# Patient Record
Sex: Female | Born: 1960 | Race: White | Hispanic: No | Marital: Married | Smoking: Never smoker
Health system: Southern US, Community
[De-identification: ages and names within clinical notes are randomized; demographics above are authoritative.]

## PROBLEM LIST (undated history)

## (undated) DIAGNOSIS — F411 Generalized anxiety disorder: Secondary | ICD-10-CM

## (undated) DIAGNOSIS — J069 Acute upper respiratory infection, unspecified: Secondary | ICD-10-CM

## (undated) DIAGNOSIS — J019 Acute sinusitis, unspecified: Secondary | ICD-10-CM

## (undated) DIAGNOSIS — R05 Cough: Secondary | ICD-10-CM

## (undated) DIAGNOSIS — K5900 Constipation, unspecified: Secondary | ICD-10-CM

## (undated) DIAGNOSIS — R1011 Right upper quadrant pain: Secondary | ICD-10-CM

## (undated) DIAGNOSIS — F329 Major depressive disorder, single episode, unspecified: Secondary | ICD-10-CM

## (undated) DIAGNOSIS — R229 Localized swelling, mass and lump, unspecified: Secondary | ICD-10-CM

## (undated) DIAGNOSIS — R5383 Other fatigue: Secondary | ICD-10-CM

## (undated) DIAGNOSIS — R059 Cough, unspecified: Secondary | ICD-10-CM

## (undated) DIAGNOSIS — R0602 Shortness of breath: Secondary | ICD-10-CM

## (undated) DIAGNOSIS — Z13220 Encounter for screening for lipoid disorders: Secondary | ICD-10-CM

## (undated) DIAGNOSIS — F3289 Other specified depressive episodes: Secondary | ICD-10-CM

## (undated) DIAGNOSIS — G4700 Insomnia, unspecified: Secondary | ICD-10-CM

## (undated) DIAGNOSIS — R5381 Other malaise: Secondary | ICD-10-CM

## (undated) HISTORY — DX: Shortness of breath: R06.02

## (undated) HISTORY — DX: Generalized anxiety disorder: F41.1

## (undated) HISTORY — PX: COSMETIC SURGERY: SHX468

## (undated) HISTORY — DX: Right upper quadrant pain: R10.11

## (undated) HISTORY — DX: Acute upper respiratory infection, unspecified: J06.9

## (undated) HISTORY — DX: Insomnia, unspecified: G47.00

## (undated) HISTORY — DX: Constipation, unspecified: K59.00

## (undated) HISTORY — DX: Other specified depressive episodes: F32.89

## (undated) HISTORY — DX: Localized swelling, mass and lump, unspecified: R22.9

## (undated) HISTORY — DX: Major depressive disorder, single episode, unspecified: F32.9

## (undated) HISTORY — PX: INCONTINENCE SURGERY: SHX676

## (undated) HISTORY — PX: FINGER AMPUTATION: SHX636

## (undated) HISTORY — DX: Other malaise: R53.81

## (undated) HISTORY — DX: Acute sinusitis, unspecified: J01.90

## (undated) HISTORY — DX: Cough: R05

## (undated) HISTORY — DX: Encounter for screening for lipoid disorders: Z13.220

## (undated) HISTORY — DX: Cough, unspecified: R05.9

## (undated) HISTORY — DX: Other fatigue: R53.83

## (undated) HISTORY — PX: BREAST SURGERY: SHX581

---

## 2004-01-23 ENCOUNTER — Observation Stay (HOSPITAL_COMMUNITY): Admission: RE | Admit: 2004-01-23 | Discharge: 2004-01-24 | Payer: Self-pay | Admitting: Gynecology

## 2004-01-25 ENCOUNTER — Emergency Department: Payer: Self-pay | Admitting: Gynecology

## 2004-01-27 ENCOUNTER — Ambulatory Visit (HOSPITAL_COMMUNITY): Admission: RE | Admit: 2004-01-27 | Discharge: 2004-01-27 | Payer: Self-pay | Admitting: Gynecology

## 2005-01-28 ENCOUNTER — Ambulatory Visit (HOSPITAL_COMMUNITY): Admission: RE | Admit: 2005-01-28 | Discharge: 2005-01-28 | Payer: Self-pay | Admitting: Gynecology

## 2005-12-12 ENCOUNTER — Ambulatory Visit: Payer: Self-pay | Admitting: Gynecology

## 2005-12-12 ENCOUNTER — Encounter (INDEPENDENT_AMBULATORY_CARE_PROVIDER_SITE_OTHER): Payer: Self-pay | Admitting: Gynecology

## 2006-06-28 ENCOUNTER — Ambulatory Visit (HOSPITAL_COMMUNITY): Admission: RE | Admit: 2006-06-28 | Discharge: 2006-06-28 | Payer: Self-pay | Admitting: Gynecology

## 2006-07-19 ENCOUNTER — Ambulatory Visit: Payer: Self-pay | Admitting: Family Medicine

## 2006-07-19 DIAGNOSIS — F3289 Other specified depressive episodes: Secondary | ICD-10-CM | POA: Insufficient documentation

## 2006-07-19 DIAGNOSIS — K5900 Constipation, unspecified: Secondary | ICD-10-CM | POA: Insufficient documentation

## 2006-07-19 DIAGNOSIS — F329 Major depressive disorder, single episode, unspecified: Secondary | ICD-10-CM

## 2006-07-19 DIAGNOSIS — G4700 Insomnia, unspecified: Secondary | ICD-10-CM

## 2006-07-20 ENCOUNTER — Encounter: Payer: Self-pay | Admitting: Family Medicine

## 2006-09-26 ENCOUNTER — Encounter: Payer: Self-pay | Admitting: Family Medicine

## 2006-09-26 LAB — CONVERTED CEMR LAB
ALT: 16 units/L (ref 0–40)
AST: 19 units/L (ref 0–37)
Basophils Relative: 1.8 % — ABNORMAL HIGH (ref 0.0–1.0)
Bilirubin, Direct: 0.1 mg/dL (ref 0.0–0.3)
Calcium: 9.1 mg/dL (ref 8.4–10.5)
Chloride: 105 meq/L (ref 96–112)
Creatinine, Ser: 0.9 mg/dL (ref 0.4–1.2)
Eosinophils Absolute: 0.3 10*3/uL (ref 0.0–0.6)
Eosinophils Relative: 5 % (ref 0.0–5.0)
GFR calc non Af Amer: 72 mL/min
Glucose, Bld: 74 mg/dL (ref 70–99)
HCT: 38.8 % (ref 36.0–46.0)
MCV: 90.2 fL (ref 78.0–100.0)
Neutrophils Relative %: 62.7 % (ref 43.0–77.0)
Platelets: 309 10*3/uL (ref 150–400)
RBC: 4.3 M/uL (ref 3.87–5.11)
RDW: 12.8 % (ref 11.5–14.6)
Total Bilirubin: 0.6 mg/dL (ref 0.3–1.2)
WBC: 7 10*3/uL (ref 4.5–10.5)

## 2006-09-28 ENCOUNTER — Emergency Department (HOSPITAL_COMMUNITY): Admission: EM | Admit: 2006-09-28 | Discharge: 2006-09-28 | Payer: Self-pay | Admitting: Emergency Medicine

## 2006-10-09 ENCOUNTER — Ambulatory Visit: Payer: Self-pay | Admitting: Family Medicine

## 2006-10-09 DIAGNOSIS — F411 Generalized anxiety disorder: Secondary | ICD-10-CM | POA: Insufficient documentation

## 2006-10-09 LAB — CONVERTED CEMR LAB
HDL: 60.6 mg/dL (ref >39.0)
LDL Cholesterol: 106 mg/dL — ABNORMAL HIGH (ref 0–99)
Triglycerides: 61 mg/dL (ref 0–149)
VLDL: 12 mg/dL (ref 0–40)

## 2006-11-09 ENCOUNTER — Ambulatory Visit: Payer: Self-pay | Admitting: Family Medicine

## 2006-11-22 LAB — CONVERTED CEMR LAB: Pap Smear: NORMAL

## 2007-01-22 ENCOUNTER — Ambulatory Visit: Payer: Self-pay | Admitting: Family Medicine

## 2007-02-09 ENCOUNTER — Ambulatory Visit: Payer: Self-pay | Admitting: Family Medicine

## 2007-03-04 ENCOUNTER — Emergency Department (HOSPITAL_COMMUNITY): Admission: EM | Admit: 2007-03-04 | Discharge: 2007-03-04 | Payer: Self-pay | Admitting: Family Medicine

## 2007-03-23 ENCOUNTER — Ambulatory Visit: Payer: Self-pay | Admitting: Family Medicine

## 2007-04-19 ENCOUNTER — Encounter (INDEPENDENT_AMBULATORY_CARE_PROVIDER_SITE_OTHER): Payer: Self-pay | Admitting: Gynecology

## 2007-04-19 ENCOUNTER — Ambulatory Visit: Payer: Self-pay | Admitting: Obstetrics & Gynecology

## 2007-05-14 ENCOUNTER — Ambulatory Visit: Payer: Self-pay | Admitting: Family Medicine

## 2007-06-26 ENCOUNTER — Ambulatory Visit: Payer: Self-pay | Admitting: Obstetrics & Gynecology

## 2007-07-10 ENCOUNTER — Ambulatory Visit: Payer: Self-pay | Admitting: Family Medicine

## 2007-07-11 ENCOUNTER — Encounter: Admission: RE | Admit: 2007-07-11 | Discharge: 2007-07-11 | Payer: Self-pay | Admitting: Family Medicine

## 2007-07-11 ENCOUNTER — Telehealth (INDEPENDENT_AMBULATORY_CARE_PROVIDER_SITE_OTHER): Payer: Self-pay | Admitting: Internal Medicine

## 2007-07-12 ENCOUNTER — Encounter: Admission: RE | Admit: 2007-07-12 | Discharge: 2007-07-12 | Payer: Self-pay | Admitting: Obstetrics & Gynecology

## 2007-07-12 ENCOUNTER — Telehealth (INDEPENDENT_AMBULATORY_CARE_PROVIDER_SITE_OTHER): Payer: Self-pay | Admitting: Internal Medicine

## 2007-07-13 ENCOUNTER — Telehealth (INDEPENDENT_AMBULATORY_CARE_PROVIDER_SITE_OTHER): Payer: Self-pay | Admitting: Internal Medicine

## 2007-07-16 ENCOUNTER — Emergency Department (HOSPITAL_COMMUNITY): Admission: EM | Admit: 2007-07-16 | Discharge: 2007-07-16 | Payer: Self-pay | Admitting: Emergency Medicine

## 2007-07-25 ENCOUNTER — Ambulatory Visit: Payer: Self-pay | Admitting: Family Medicine

## 2007-07-25 LAB — CONVERTED CEMR LAB
GFR calc Af Amer: 116 mL/min
Sodium: 134 meq/L — ABNORMAL LOW (ref 135–145)

## 2007-07-27 ENCOUNTER — Ambulatory Visit: Payer: Self-pay | Admitting: Cardiovascular Disease

## 2007-08-03 ENCOUNTER — Telehealth: Payer: Self-pay | Admitting: Family Medicine

## 2007-08-07 ENCOUNTER — Telehealth: Payer: Self-pay | Admitting: Family Medicine

## 2007-08-07 DIAGNOSIS — R05 Cough: Secondary | ICD-10-CM

## 2007-08-09 ENCOUNTER — Ambulatory Visit: Payer: Self-pay | Admitting: Internal Medicine

## 2007-08-13 ENCOUNTER — Telehealth (INDEPENDENT_AMBULATORY_CARE_PROVIDER_SITE_OTHER): Payer: Self-pay | Admitting: *Deleted

## 2007-08-14 ENCOUNTER — Ambulatory Visit: Payer: Self-pay | Admitting: Cardiovascular Disease

## 2007-09-26 ENCOUNTER — Ambulatory Visit: Payer: Self-pay | Admitting: Internal Medicine

## 2007-12-31 ENCOUNTER — Encounter: Payer: Self-pay | Admitting: Family Medicine

## 2008-01-08 ENCOUNTER — Ambulatory Visit: Payer: Self-pay | Admitting: Obstetrics and Gynecology

## 2008-01-10 ENCOUNTER — Encounter: Admission: RE | Admit: 2008-01-10 | Discharge: 2008-01-10 | Payer: Self-pay | Admitting: Obstetrics & Gynecology

## 2008-01-23 ENCOUNTER — Ambulatory Visit: Payer: Self-pay | Admitting: Family Medicine

## 2008-01-23 DIAGNOSIS — H8110 Benign paroxysmal vertigo, unspecified ear: Secondary | ICD-10-CM | POA: Insufficient documentation

## 2008-02-13 ENCOUNTER — Encounter (INDEPENDENT_AMBULATORY_CARE_PROVIDER_SITE_OTHER): Payer: Self-pay | Admitting: Internal Medicine

## 2008-02-13 ENCOUNTER — Ambulatory Visit: Payer: Self-pay | Admitting: Family Medicine

## 2008-02-14 ENCOUNTER — Ambulatory Visit: Payer: Self-pay | Admitting: Internal Medicine

## 2008-02-14 LAB — CONVERTED CEMR LAB
ALT: 18 units/L (ref 0–35)
Albumin: 4.1 g/dL (ref 3.5–5.2)
Basophils Absolute: 0 10*3/uL (ref 0.0–0.1)
Basophils Relative: 1 % (ref 0–1)
Eosinophils Absolute: 0.2 10*3/uL (ref 0.0–0.7)
Eosinophils Relative: 3 % (ref 0–5)
HCT: 37.1 % (ref 36.0–46.0)
Hemoglobin: 12.1 g/dL (ref 12.0–15.0)
Lipase: 36 units/L (ref 0–75)
MCHC: 32.6 g/dL (ref 30.0–36.0)
MCV: 89.6 fL (ref 78.0–100.0)
Monocytes Absolute: 0.6 10*3/uL (ref 0.1–1.0)
Monocytes Relative: 10 % (ref 3–12)
RBC: 4.14 M/uL (ref 3.87–5.11)
RDW: 13.4 % (ref 11.5–15.5)
Total Protein: 7.4 g/dL (ref 6.0–8.3)

## 2008-02-18 ENCOUNTER — Telehealth: Payer: Self-pay | Admitting: Family Medicine

## 2008-02-18 DIAGNOSIS — J984 Other disorders of lung: Secondary | ICD-10-CM

## 2008-02-19 ENCOUNTER — Ambulatory Visit: Payer: Self-pay | Admitting: Cardiology

## 2008-02-20 ENCOUNTER — Encounter (INDEPENDENT_AMBULATORY_CARE_PROVIDER_SITE_OTHER): Payer: Self-pay | Admitting: Internal Medicine

## 2008-04-15 ENCOUNTER — Ambulatory Visit: Payer: Self-pay | Admitting: Family Medicine

## 2008-06-16 ENCOUNTER — Ambulatory Visit: Payer: Self-pay | Admitting: Family Medicine

## 2008-06-17 LAB — CONVERTED CEMR LAB
ALT: 17 units/L (ref 0–35)
AST: 18 units/L (ref 0–37)
BUN: 15 mg/dL (ref 6–23)
Bilirubin, Direct: 0 mg/dL (ref 0.0–0.3)
CO2: 30 meq/L (ref 19–32)
Chloride: 110 meq/L (ref 96–112)
Cholesterol: 211 mg/dL — ABNORMAL HIGH (ref 0–200)
Creatinine, Ser: 1 mg/dL (ref 0.4–1.2)
Potassium: 4.2 meq/L (ref 3.5–5.1)
TSH: 1.21 microintl units/mL (ref 0.35–5.50)
Total Bilirubin: 0.6 mg/dL (ref 0.3–1.2)
Total Protein: 6.8 g/dL (ref 6.0–8.3)
VLDL: 23.8 mg/dL (ref 0.0–40.0)
Vitamin B-12: 266 pg/mL (ref 211–911)

## 2008-06-18 ENCOUNTER — Encounter: Payer: Self-pay | Admitting: Family Medicine

## 2008-06-18 ENCOUNTER — Other Ambulatory Visit: Admission: RE | Admit: 2008-06-18 | Discharge: 2008-06-18 | Payer: Self-pay | Admitting: Family Medicine

## 2008-06-18 ENCOUNTER — Ambulatory Visit: Payer: Self-pay | Admitting: Family Medicine

## 2008-06-23 ENCOUNTER — Encounter (INDEPENDENT_AMBULATORY_CARE_PROVIDER_SITE_OTHER): Payer: Self-pay | Admitting: *Deleted

## 2008-07-14 ENCOUNTER — Encounter: Admission: RE | Admit: 2008-07-14 | Discharge: 2008-07-14 | Payer: Self-pay | Admitting: Family Medicine

## 2008-08-26 ENCOUNTER — Telehealth: Payer: Self-pay | Admitting: Family Medicine

## 2009-04-14 ENCOUNTER — Ambulatory Visit: Payer: Self-pay | Admitting: Family Medicine

## 2009-05-20 IMAGING — CT CT ABDOMEN W/ CM
2 of 5 series · 17 of 46 positions shown, 19 images · IV contrast (Omnipaque 300)
Comparison: None.

CT ABDOMEN

CLINICAL DATA: Severe right upper quadrant and epigastric
abdominal pain that increases after meals, for the past 5 days.
Chronic constipation.

CT ABDOMEN AND PELVIS WITH CONTRAST
TECHNIQUE: Multidetector CT imaging of the abdomen and pelvis was
performed using the standard protocol following bolus
administration of intravenous contrast.
Contrast: 100 ml Imnipaque-RRR

[Series 2: abd_pel 5.0 b30f st · axial · 0.74mm/px · z∈[-447,-47]mm · 14 of 90 slices shown, 16 images]
[im 5/90  soft-tissue]
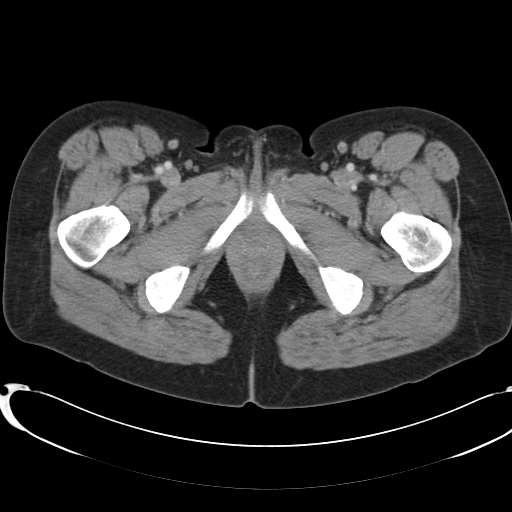
[im 5/90  bone]
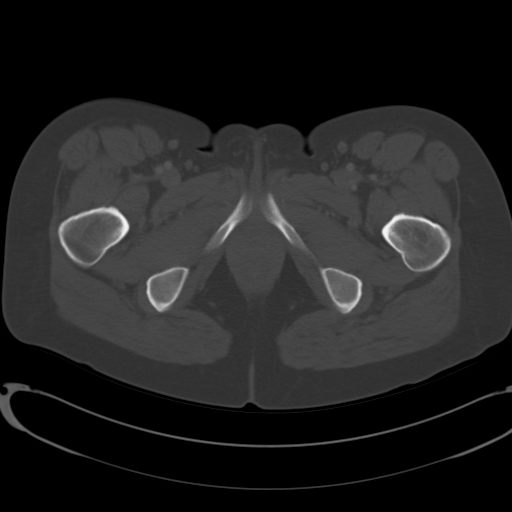
[im 10/90  soft-tissue]
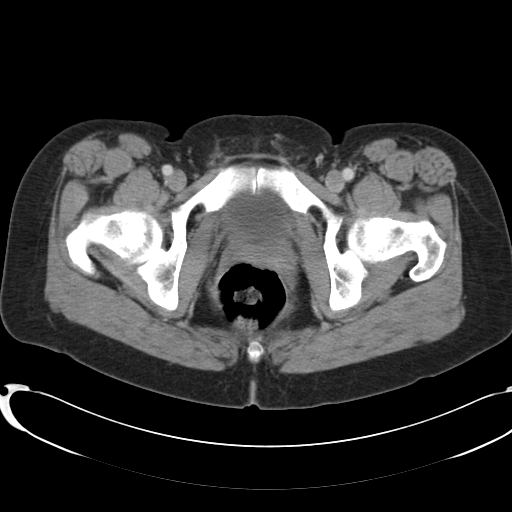
[im 19/90  soft-tissue]
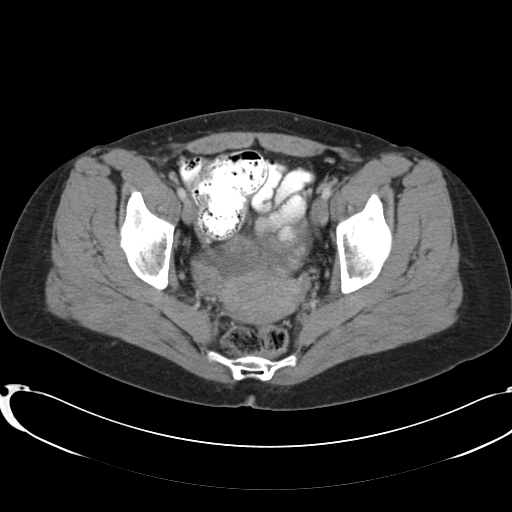
[im 24/90  soft-tissue]
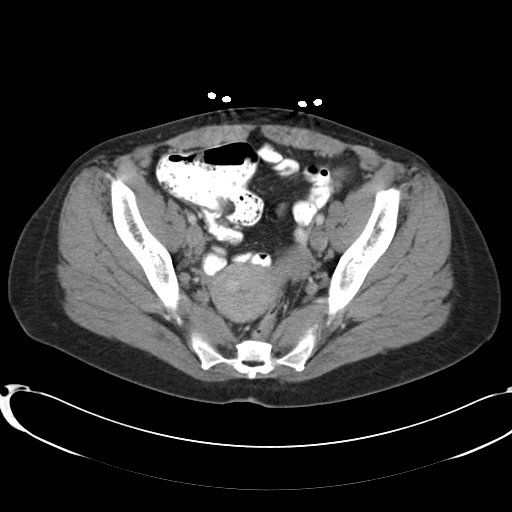
[im 29/90  soft-tissue]
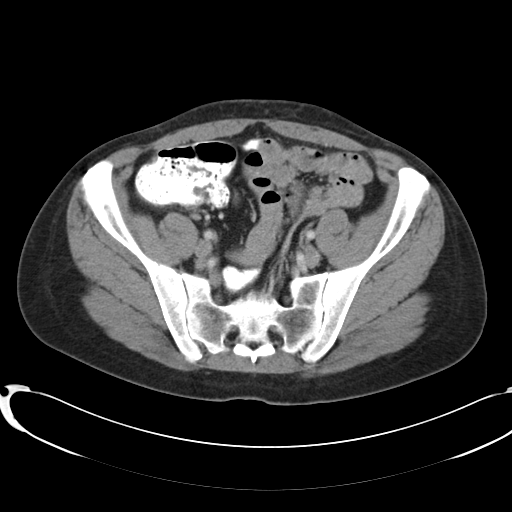
[im 38/90  soft-tissue]
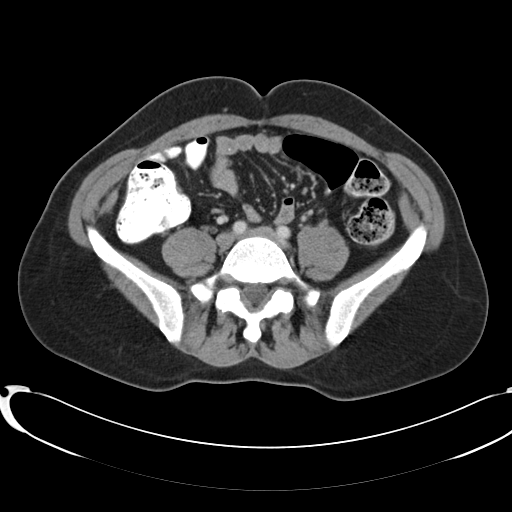
[im 43/90  soft-tissue]
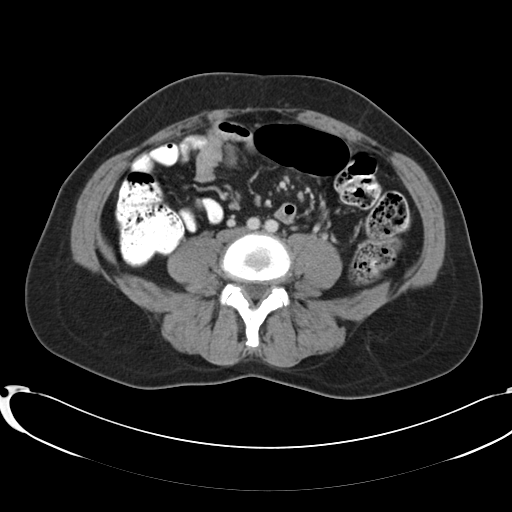
[im 47/90  soft-tissue]
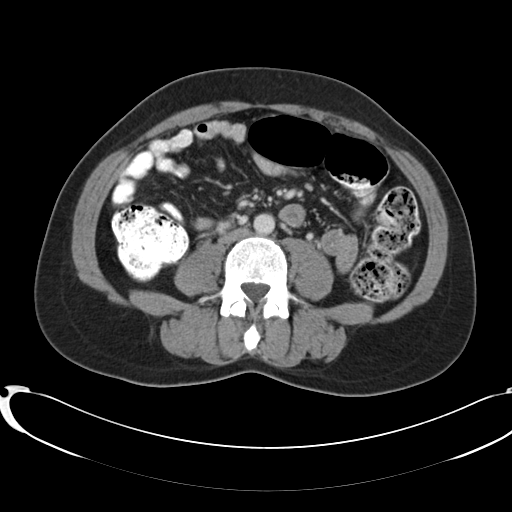
[im 52/90  soft-tissue]
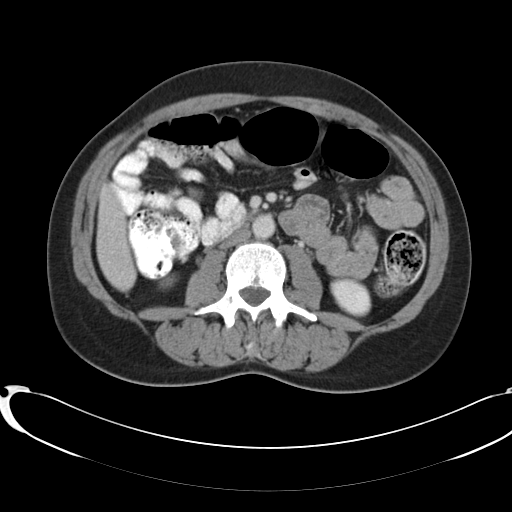
[im 52/90  bone]
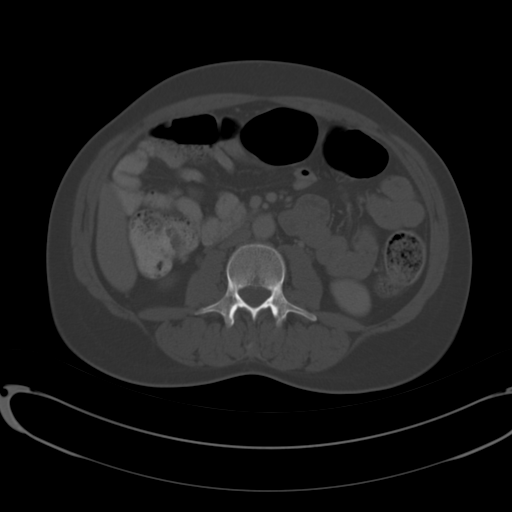
[im 61/90  soft-tissue]
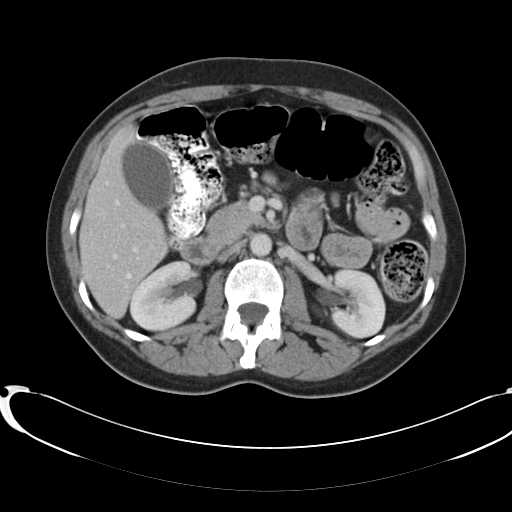
[im 66/90  soft-tissue]
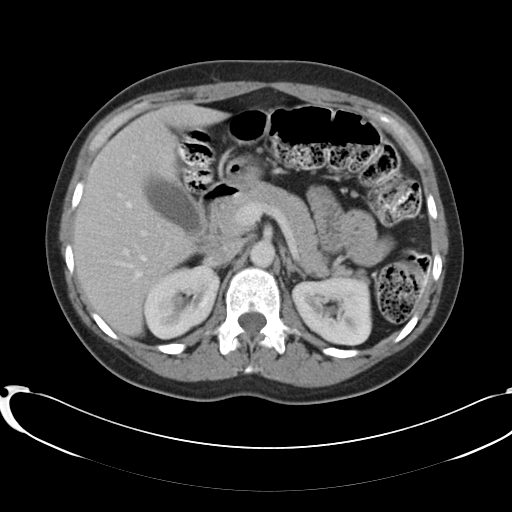
[im 71/90  soft-tissue]
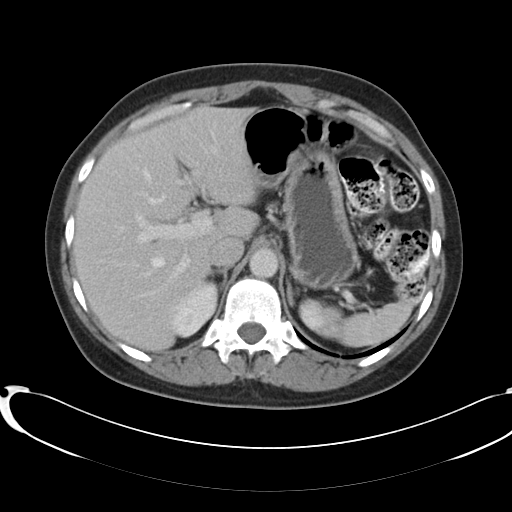
[im 80/90  soft-tissue]
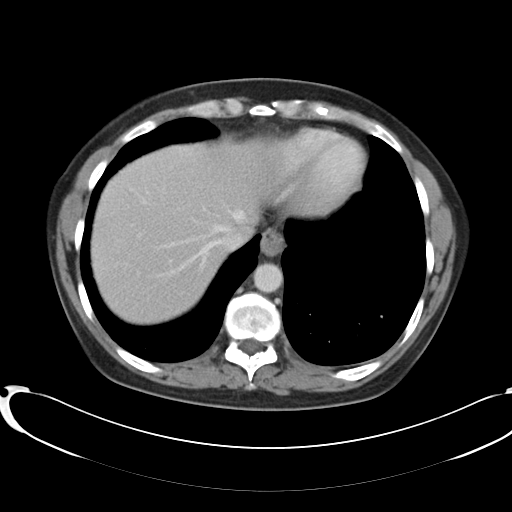
[im 85/90  soft-tissue]
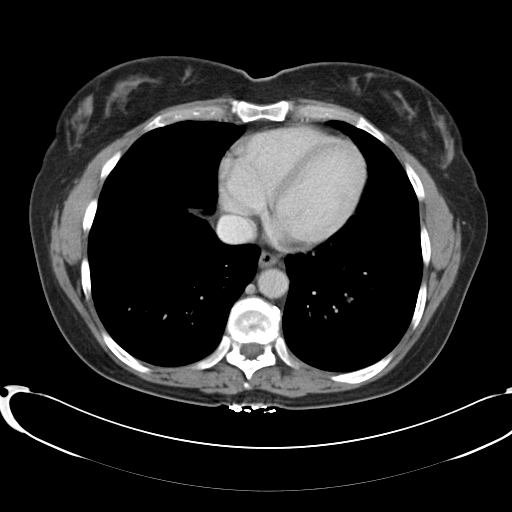

[Series 602: <mpr thick range> · coronal · 0.89mm/px · 3 of 86 slices shown]
[im 29/86  soft-tissue]
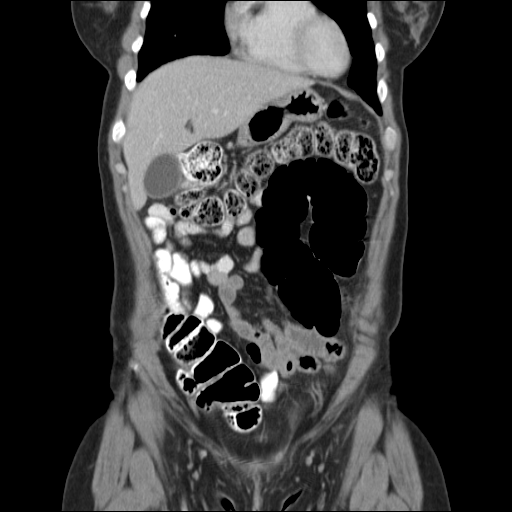
[im 38/86  soft-tissue]
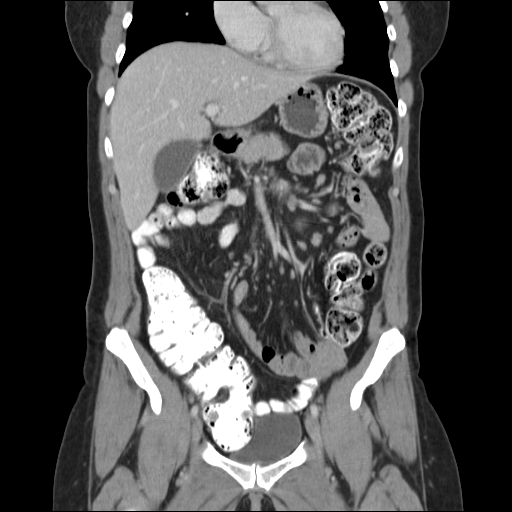
[im 48/86  soft-tissue]
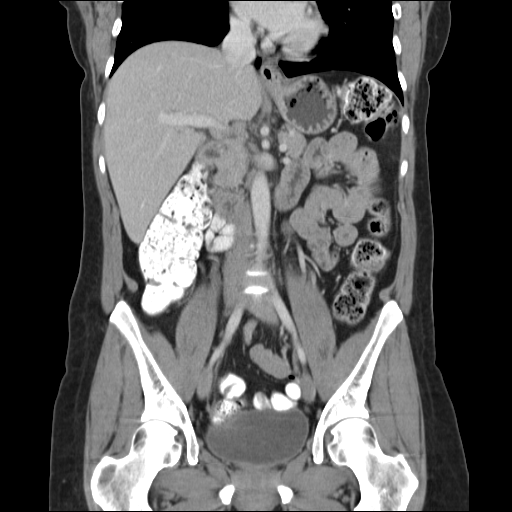

[17 of 46 positions shown; findings below may reference images not displayed]

FINDINGS: 5 mm subpleural nodule in the right lower lobe
laterally.  Unremarkable liver, spleen, pancreas, gallbladder,
adrenal glands and left kidney.  Tiny upper pole right renal cyst.
No gastrointestinal abnormalities or enlarged lymph nodes.  No free
peritoneal fluid or air.  Unremarkable bones.
IMPRESSION: 1.  5 mm right lobe nodule.  Chest CT, with contrast, is
recommended to evaluate for the presence or absence of other
nodules and to establish a baseline for follow up.
2.  No acute abnormality.

CT PELVIS
FINDINGS: Unremarkable urinary bladder, uterus, ovaries and bowel
loops.  No masses, enlarged lymph nodes, free peritoneal fluid or
free peritoneal air.  Unremarkable bones.
IMPRESSION: Normal examination.

## 2009-08-20 ENCOUNTER — Ambulatory Visit: Payer: Self-pay | Admitting: Family Medicine

## 2009-08-20 DIAGNOSIS — M19049 Primary osteoarthritis, unspecified hand: Secondary | ICD-10-CM | POA: Insufficient documentation

## 2009-09-29 ENCOUNTER — Ambulatory Visit: Payer: Self-pay | Admitting: Internal Medicine

## 2009-09-29 DIAGNOSIS — R42 Dizziness and giddiness: Secondary | ICD-10-CM

## 2009-11-03 ENCOUNTER — Telehealth (INDEPENDENT_AMBULATORY_CARE_PROVIDER_SITE_OTHER): Payer: Self-pay | Admitting: *Deleted

## 2009-11-05 ENCOUNTER — Ambulatory Visit: Payer: Self-pay | Admitting: Family Medicine

## 2009-11-06 LAB — CONVERTED CEMR LAB
Alkaline Phosphatase: 55 units/L (ref 39–117)
Bilirubin, Direct: 0.1 mg/dL (ref 0.0–0.3)
Calcium: 9.2 mg/dL (ref 8.4–10.5)
Cholesterol: 234 mg/dL — ABNORMAL HIGH (ref 0–200)
Creatinine, Ser: 0.8 mg/dL (ref 0.4–1.2)
GFR calc non Af Amer: 84.59 mL/min (ref >60)
Sodium: 141 meq/L (ref 135–145)
Total Bilirubin: 0.5 mg/dL (ref 0.3–1.2)
Total CHOL/HDL Ratio: 4
Triglycerides: 54 mg/dL (ref 0.0–149.0)
VLDL: 10.8 mg/dL (ref 0.0–40.0)

## 2009-11-10 ENCOUNTER — Ambulatory Visit: Payer: Self-pay | Admitting: Family Medicine

## 2009-11-10 DIAGNOSIS — E7800 Pure hypercholesterolemia, unspecified: Secondary | ICD-10-CM

## 2009-11-13 LAB — CONVERTED CEMR LAB
Basophils Relative: 1.3 % (ref 0.0–3.0)
Eosinophils Absolute: 0.1 10*3/uL (ref 0.0–0.7)
Eosinophils Relative: 1.2 % (ref 0.0–5.0)
Folate: 15.1 ng/mL
Hemoglobin: 13.2 g/dL (ref 12.0–15.0)
Lymphocytes Relative: 32.2 % (ref 12.0–46.0)
MCHC: 33.6 g/dL (ref 30.0–36.0)
Monocytes Relative: 9.2 % (ref 3.0–12.0)
Neutrophils Relative %: 56.1 % (ref 43.0–77.0)
RBC: 4.22 M/uL (ref 3.87–5.11)
TSH: 1.51 microintl units/mL (ref 0.35–5.50)
Vitamin B-12: 329 pg/mL (ref 211–911)
WBC: 6.9 10*3/uL (ref 4.5–10.5)

## 2009-11-16 ENCOUNTER — Telehealth: Payer: Self-pay | Admitting: Family Medicine

## 2009-11-26 ENCOUNTER — Encounter: Admission: RE | Admit: 2009-11-26 | Discharge: 2009-11-26 | Payer: Self-pay | Admitting: Family Medicine

## 2010-02-08 ENCOUNTER — Ambulatory Visit: Payer: Self-pay | Admitting: Family Medicine

## 2010-02-16 LAB — CONVERTED CEMR LAB
Direct LDL: 117.8 mg/dL
HDL: 61.7 mg/dL (ref >39.00)
Triglycerides: 74 mg/dL (ref 0.0–149.0)
VLDL: 14.8 mg/dL (ref 0.0–40.0)

## 2010-03-13 ENCOUNTER — Encounter: Payer: Self-pay | Admitting: Gynecology

## 2010-03-14 ENCOUNTER — Encounter: Payer: Self-pay | Admitting: Obstetrics & Gynecology

## 2010-03-17 ENCOUNTER — Ambulatory Visit
Admission: RE | Admit: 2010-03-17 | Discharge: 2010-03-17 | Payer: Self-pay | Source: Home / Self Care | Attending: Family Medicine | Admitting: Family Medicine

## 2010-03-17 DIAGNOSIS — J019 Acute sinusitis, unspecified: Secondary | ICD-10-CM | POA: Insufficient documentation

## 2010-03-24 NOTE — Assessment & Plan Note (Signed)
Summary: SWOLLEN,KNOT ON PINKY/CLE   Vital Signs:  Patient profile:   50 year old female Height:      65.25 inches Weight:      159.6 pounds BMI:     26.45 Temp:     98.5 degrees F oral Pulse rate:   68 / minute Pulse rhythm:   regular BP sitting:   140 / 80  (left arm) Cuff size:   regular  Vitals Entered By: Benny Lennert CMA Duncan Dull) (August 20, 2009 4:03 PM)  History of Present Illness: Chief complaint swollen know on pinky  50 year old female:  left digit   the patient had some concerns about her left DIP joint, and had some other questions about diffuse hand osteoarthritis.  She does not have any red hot joints, no swelling joints, no swollen joints in the morning. Does have some occasional aching, diffusely in her hands and wrist.  She is in a grandmother with a history rheumatoid arthritis, however her other rheumatological history is negative in terms of her family history.  Review of systems: No neuropathic symptoms, no paresthesias, no numbness, some weakness and occasional  and insidious loss of strength in her hands.  GEN: Well-developed,well-nourished,in no acute distress; alert,appropriate and cooperative throughout examination HEENT: Normocephalic and atraumatic without obvious abnormalities. No apparent alopecia or balding. Ears, externally no deformities PULM: Breathing comfortably in no respiratory distress EXT: No clubbing, cyanosis, or edema PSYCH: Normally interactive. Cooperative during the interview. Pleasant. Friendly and conversant. Not anxious or depressed appearing. Normal, full affect.    B hand Ecchymosis or edema: neg ROM wrist/hand/digits: full  Carpals, MCP's, digits: NT Distal Ulna and Radius: NT Ecchymosis or edema: neg No instability Cysts/nodules: heberden's nodes and few bouchards Digit triggering: neg Finkelstein's test: neg Snuffbox tenderness: neg Scaphoid tubercle: NT Resisted supination: NT Full composite fist, no  malrotation Grip, all digits: 5/5 str DIPJT: NT PIP JT: NT MCP JT: NT No tenosynovitis Axial load test: neg Atrophy: neg  Hand sensation: intact   Allergies: 1)  Biaxin (Clarithromycin)  Past History:  Past medical, surgical, family and social histories (including risk factors) reviewed, and no changes noted (except as noted below).  Past Medical History: Reviewed history from 09/26/2007 and no changes required.   MASS, AXILLA (ICD-782.2) DYSPNEA (ICD-786.05) URI (ICD-465.9) FATIGUE (ICD-780.79) SINUSITIS- ACUTE-NOS (ICD-461.9) neg sinus ct 08/14/07 ANXIETY (ICD-300.00) SCREENING FOR LIPOID DISORDERS (ICD-V77.91) INSOMNIA, CHRONIC (ICD-307.42) RUQ PAIN (ICD-789.01) CONSTIPATION NOS (ICD-564.00) DEPRESSION (ICD-311) Cough   - Dec 08 onset   - 08/14/07 Sinus CT completely nl    Past Surgical History: Reviewed history from 07/19/2006 and no changes required. 08-01-2003  bladder sling 1988 C Section 04/2005 breast aug, mini tummy tuck  Family History: Reviewed history from 09/26/2007 and no changes required. father age 72  high chol, CAD s/p stent depression mother age 46 stomach/intestine  issues 2 sisters/1 brother endometriosis, stomache issues family history of gallstone aunt:colon cancer neg resp dz/ atopy  Social History: Reviewed history from 05/14/2007 and no changes required. 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 Takes one tab daily  Patient Instructions: 1)  Tylenol: 2 tablets up to 3-4  times a day 2)  Regular NSAIDS are helpful (avoid in kidney disease and ulcers) 3)   Topical Capzaicin Cream, as needed (wear glove to put on) 4)  Biofreeze 5)  Glucosamine and Chondroitin often helpful 6)  Omega-3 fish oils may help 7)  Ice joints on bad days, 20 min, 2-3 x / day 8)  REGULAR EXERCISE: swimming, Yoga, Tai Chi, bicycle (NON-IMPACT activity   Current Allergies (reviewed today): BIAXIN (CLARITHROMYCIN)

## 2010-03-24 NOTE — Progress Notes (Signed)
----   Converted from flag ---- ---- 11/03/2009 9:53 AM, Kerby Nora MD wrote: CMET, lipids Dx v77.91  ---- 11/02/2009 11:35 AM, Liane Comber CMA (AAMA) wrote: Lab orders please! Good Morning! This pt is scheduled for cpx labs [place], which labs to draw and dx codes to use? Thanks Tasha ------------------------------

## 2010-03-24 NOTE — Assessment & Plan Note (Signed)
Summary: CPX/CLE   Vital Signs:  Patient profile:   50 year old female Height:      65.25 inches Weight:      160 pounds BMI:     26.52 Temp:     98.7 degrees F oral Pulse rate:   76 / minute Pulse rhythm:   regular BP sitting:   120 / 72  (left arm) Cuff size:   regular  Vitals Entered By: Linde Gillis CMA Duncan Dull) (November 10, 2009 2:04 PM) CC: adult physicial   History of Present Illness: The patient is here for annual wellness exam and preventative care.     B handosteoarthritis. Pain in in left pinky DIP. Now more soreness in hands, stiffness in hands. Trouble opening things. On computer all day.  Hand exercsies. Using capscacin cream  Nodules in severeal DIP joints.  Chronic constipation..Using [place] daily. Straiing with BMs...BM every 3-4 days. HArd.  Bloating  Using magnesium citrate as needed. Eats some fiber in diet. Drinks water daily. Walking daily.  Checked TSH, cbc .Marland Kitchennml  in 05/2008  Fatigue since then.  Stress and anxiety at work...Marland Kitchennot dong well in last year off SSRI.   Anxiety..poor control. Sleeping olkay at night.   Problems Prior to Update: 1)  Dizziness  (ICD-780.4) 2)  Osteoarthritis, Hands, Bilateral  (ICD-715.94) 3)  Routine Gynecological Examination  (ICD-V72.31) 4)  Well Woman  (ICD-V70.0) 5)  Other Screening Mammogram  (ICD-V76.12) 6)  Pulmonary Nodule  (ICD-518.89) 7)  Abdominal Pain, Generalized  (ICD-789.07) 8)  Benign Paroxysmal Positional Vertigo  (ICD-386.11) 9)  Cough, Chronic  (ICD-786.2) 10)  Chest Pain, Right  (ICD-786.50) 11)  Mass, Axilla  (ICD-782.2) 12)  Dyspnea  (ICD-786.05) 13)  Uri  (ICD-465.9) 14)  Fatigue  (ICD-780.79) 15)  Sinusitis- Acute-nos  (ICD-461.9) 16)  Anxiety  (ICD-300.00) 17)  Screening For Lipoid Disorders  (ICD-V77.91) 18)  Insomnia, Chronic  (ICD-307.42) 19)  Ruq Pain  (ICD-789.01) 20)  Constipation Nos  (ICD-564.00) 21)  Depression  (ICD-311)  Current Medications (verified): 1)   Multivitamins   Tabs (Multiple Vitamin) .... Takes One Tab Daily 2)  Antivert 25 Mg Tabs (Meclizine Hcl) .... Use As Directed  Allergies: 1)  Biaxin (Clarithromycin)  Past History:  Past medical, surgical, family and social histories (including risk factors) reviewed, and no changes noted (except as noted below).  Past Medical History: Reviewed history from 09/26/2007 and no changes required.   MASS, AXILLA (ICD-782.2) DYSPNEA (ICD-786.05) URI (ICD-465.9) FATIGUE (ICD-780.79) SINUSITIS- ACUTE-NOS (ICD-461.9) neg sinus ct 08/14/07 ANXIETY (ICD-300.00) SCREENING FOR LIPOID DISORDERS (ICD-V77.91) INSOMNIA, CHRONIC (ICD-307.42) RUQ PAIN (ICD-789.01) CONSTIPATION NOS (ICD-564.00) DEPRESSION (ICD-311) Cough   - Dec 08 onset   - 08/14/07 Sinus CT completely nl    Past Surgical History: Reviewed history from 07/19/2006 and no changes required. 08/02/2003  bladder sling 1988 C Section 04/2005 breast aug, mini tummy tuck  Family History: Reviewed history from 09/26/2007 and no changes required. father age 41  high chol, CAD s/p stent depression mother age 54 stomach/intestine  issues 2 sisters/1 brother endometriosis, stomache issues family history of gallstone aunt:colon cancer neg resp dz/ atopy  Social History: Reviewed history from 05/14/2007 and no changes required. 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  Physical Exam  General:  Well-developed,well-nourished,in no acute distress; alert,appropriate and cooperative throughout examination  Head:  Normocephalic and atraumatic without obvious abnormalities. No apparent alopecia or balding. Eyes:  No corneal or conjunctival inflammation noted. EOMI. Perrla. Funduscopic exam benign, without hemorrhages, exudates or papilledema.  Vision grossly normal. Ears:  External ear exam shows no significant lesions or deformities.  Otoscopic examination reveals clear canals, tympanic membranes are intact bilaterally without bulging, retraction, inflammation or discharge. Hearing is grossly normal bilaterally. Nose:  External nasal examination shows no deformity or inflammation. Nasal mucosa are pink and moist without lesions or exudates. Mouth:  Oral mucosa and oropharynx without lesions or exudates.  Teeth in good repair. Neck:  No deformities, masses, or tenderness noted.  no bruits Breasts:  No mass, nodules, thickening, tenderness, bulging, retraction, inflamation, nipple discharge or skin changes noted.  Well healed scar at nipple and under B breasts from reduction.  Extra tissue right axillae ..soft. Lungs:  Normal respiratory effort, chest expands symmetrically. Lungs are clear to auscultation, no crackles or wheezes. Heart:  Normal rate and regular rhythm. S1 and S2 normal without gallop, murmur, click, rub or other extra sounds. Abdomen:  Bowel sounds positive,abdomen soft and non-tender without masses, organomegaly or hernias noted. Genitalia:  Pelvic Exam:        External: normal female genitalia without lesions or masses        Vagina: normal without lesions or masses        Cervix: normal without lesions or masses        Adnexa: normal bimanual exam without masses or fullness        Uterus: normal by palpation        Pap smear: not performed Skin:  Intact without suspicious lesions or rashes Psych:  Oriented X3, memory intact for recent and remote, normally interactive, good eye contact, tearful, and slightly anxious.     Impression & Recommendations:  Problem # 1:  WELL WOMAN (ICD-V70.0) The patient's preventative maintenance and recommended screening tests for an annual wellness exam were reviewed in full today. Brought up to date unless services declined.  Counselled on the importance of diet, exercise, and  its role in overall health and mortality. The patient's FH and SH was reviewed, including their home life, tobacco status, and drug and alcohol status.     Problem # 2:  ROUTINE GYNECOLOGICAL EXAMINATION (ICD-V72.31) PAP every 2-3 years..DVE this year wnl.   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 Usually at goal LDL ,130  Problem # 5:  CONSTIPATION NOS (ICD-564.00) Add benefiber slowly to diet.  Increase fiber, water and exercise.  Use miralax daily to every few days for constipation.  Call if not improving in next few months.   reeval TSH, cc  Problem # 6:  ANXIETY (ICD-300.00) Poor control...restart sertraline. Follow up on phone in 1 month.  Her updated medication list for this problem includes:    Sertraline Hcl 25 Mg Tabs (Sertraline hcl) .Marland Kitchen... Take 1 tablet by mouth once a day  Re-eval B12 for fatigue.   Complete Medication List: 1)  Multivitamins Tabs (Multiple vitamin) .... Takes one tab daily 2)  Antivert 25 Mg Tabs (Meclizine hcl) .... Use as directed 3)  Sertraline Hcl 25 Mg Tabs (Sertraline hcl) .... Take 1 tablet by mouth once a day  Other Orders: TLB-B12 + Folate Pnl (16109_60454-U98/JXB) TLB-TSH (Thyroid Stimulating Hormone) (84443-TSH) TLB-CBC Platelet - w/Differential (85025-CBCD) Radiology Referral (Radiology)  Patient Instructions: 1)  Add Glucosamine and Chondroitin 500 mg 1 to 3 times daily. can also consider fish oil 2000 mg daily. 2)  Add benefiber slowly to diet. 3)   Increase fiber, water and exercise. 4)  Use miralax daily to every few days. for constipation. 5)   Call if not improving in next few months.  6)  Start sertraline daily for mood. 7)   Call if not improving in 1 month. 8)   Work on low cholesterol diet. 9)  Return for  Recheck fasting LIPIDS  in 3 months Dx 272.0     10)  Referral Appointment Information 11)  Day/Date: 12)  Time: 13)  Place/MD: 14)  Address: 15)  Phone/Fax: 16)  Patient given appointment information. Information/Orders faxed/mailed. Prescriptions: SERTRALINE HCL 25 MG TABS (SERTRALINE HCL) Take 1 tablet by mouth once a day  #30 x 5   Entered and Authorized by:   Kerby Nora MD   Signed by:   Kerby Nora MD on 11/10/2009   Method used:   [facility]        [facility]  [place] (retail)       [address]       [place], [place]  [address]       Ph: [phone] or [phone]       Fax: [phone]   RxID:   [id number]   Current Allergies (reviewed today): BIAXIN (CLARITHROMYCIN)  Flu Vaccine Result Date:  11/10/2009 Flu Vaccine Result:  at work  Flu Vaccine Next Due:  1 yr

## 2010-03-24 NOTE — Progress Notes (Signed)
Summary: wants rx for b12   Phone Note Call from Patient   Caller: Patient Call For: Kerby Nora MD Summary of Call: Patient is requesting a rx for vit b 12 1000 micrograms  [facility] [place] so that she can use her flex card.  Initial call taken by: Melody Comas,  November 16, 2009 9:03 AM  Follow-up for Phone Call        do this 1 by mouth daily Follow-up by: Hannah Beat MD,  November 16, 2009 9:33 AM    New/Updated Medications: VITAMIN B-12 1000 MCG TABS (CYANOCOBALAMIN) take one tablet daily Prescriptions: VITAMIN B-12 1000 MCG TABS (CYANOCOBALAMIN) take one tablet daily  #30 x 11   Entered by:   Benny Lennert CMA (AAMA)   Authorized by:   Hannah Beat MD   Signed by:   Benny Lennert CMA (AAMA) on 11/16/2009   Method used:   [facility]        [facility]  [place] (retail)       [address]       [place], [place]  [address]       Ph: [phone] or [phone]       Fax: [phone]   RxID:   [id number]

## 2010-03-24 NOTE — Assessment & Plan Note (Signed)
Summary: DIZZY   Vital Signs:  Patient profile:   50 year old female Weight:      158.25 pounds Temp:     98.6 degrees F oral Pulse rate:   74 / minute Pulse rhythm:   regular BP sitting:   120 / 80  (left arm) BP standing:   120 / 78  (left arm) Cuff size:   regular  Vitals Entered By: Selena Batten Dance CMA Duncan Dull) (September 29, 2009 11:34 AM)   History of Present Illness: CC: dizzy  4d ago, at bedtime felt "room spinning" with nausea.  3d ago had HA but able to walk 2 miles then afterwards dizzy, nauseated, HA.  No appetite.  HA continued 2d ago, but still able to walk 2 miles.  + diaphoretic.  Took cold showers and tylenol/ibuprofen.  Yesterday felt OK, then started having nausea (no vomiting).  Has been taking meclizine (started yesterday).  Feels lightheaded.  Started period yesterday (h/o menstrual headaches).  Worse with leaning head back.  Not feeling sick today.  + pressure HA occipital region travels to right orbital area.  Dizziness not positional, + vertigo initially, now more lightheaded and slight gait imbalance.  + h/o vertigo 07-28-2002 (not like this episode, much more severe then), then again Jul 28, 2007 (like this presentation).   No LOC, syncope or presyncope.  No recent viral illness.  No sick contacts at home.  No tinnitus, no loss of hearing.  + h/o more frequent headches, but attributed to increased stress  weaned self off zoloft in Feb.  Current Medications (verified): 1)  Multivitamins   Tabs (Multiple Vitamin) .... 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 No apparent alopecia or balding. Eyes:  No corneal or conjunctival inflammation noted. EOMI. Perrla.  Ears:  External ear exam shows no significant lesions or deformities.  Otoscopic examination reveals clear canals, tympanic membranes are intact bilaterally without bulging, retraction, inflammation or discharge. Hearing is grossly normal bilaterally. Nose:  External nasal examination shows no deformity or inflammation. Nasal mucosa are pink and moist without lesions or exudates. Mouth:  Oral mucosa and oropharynx without lesions or exudates.  Teeth in good repair. Neck:  No deformities, masses, or tenderness noted.  no bruits Extremities:  no edema Neurologic:  CN 2-12 intact.  Station and gait are normal. Sensory, motor and coordinative functions appear intact.  normal cerebellar function by finger-nose, negative [place].  Sxs not reproducible with neck extension/lateral rotation Skin:  Intact without suspicious lesions or rashes   Impression & Recommendations:  Problem # 1:  DIZZINESS (ICD-780.4) orthostatics negative, reassuring neuro exam.  no bruits, doubt central or basilar insufficiency.  Not consistent this visit with BPPV.  treat as presumed labrynthitis vs vestibular neuritis with phenergan for nausea, meclizine for vertigo.  RTC if not improving or progressively worsening headaches.  red flags to return discussed.  Her updated medication list for this problem includes:    Antivert 25 Mg Tabs (Meclizine hcl) ..... Use as directed    Promethazine Hcl 25 Mg Tabs (Promethazine hcl) .Marland KitchenMarland KitchenMarland KitchenMarland Kitchen  One by mouth q6 hours as needed nausea  Complete Medication List: 1)  Multivitamins Tabs (Multiple vitamin) .... Takes one tab daily 2)  Antivert 25 Mg Tabs (Meclizine hcl) .... Use as directed 3)  Promethazine Hcl 25 Mg Tabs (Promethazine hcl) .... One by mouth q6 hours as needed nausea  Patient  Instructions: 1)  You could have a little inflammation of your inner ear or the nerve that controls your sense of balance.  2)  I think you should feel better each day.  Your neurologic exam today was normal. 3)  If worsening or any vomiting, worsening headaches, or other concerning symptoms, please return to be seen. 4)  Pleasure to meet you today. Prescriptions: PROMETHAZINE HCL 25 MG TABS (PROMETHAZINE HCL) one by mouth q6 hours as needed nausea  #30 x 0   Entered and Authorized by:   Eustaquio Boyden  MD   Signed by:   Eustaquio Boyden  MD on 09/29/2009   Method used:   [facility]        [facility]  [place] [facility]retail[facility]       [address]       [place], [place]  [address]       Ph: [phone] or [phone]       Fax: [phone]   RxID:   [id number]   Current Allergies (reviewed today): BIAXIN (CLARITHROMYCIN)  Laboratory Results       Vital Signs:  Patient Profile:   50 Years Old Female Height:     65.25 inches Weight:      158.25 pounds Temp:     98.6 degrees F oral Pulse rate:   74 / minute Pulse rhythm:   regular BP sitting:   120 / 80 BP standing:   120 / 78 Cuff size:   regular

## 2010-03-25 NOTE — Assessment & Plan Note (Signed)
Summary: ? SINUS INFECTION/ lb   Vital Signs:  Patient profile:   50 year old female Height:      65.25 inches Weight:      164.75 pounds BMI:     27.30 Temp:     98.4 degrees F oral Pulse rate:   76 / minute Pulse rhythm:   regular BP sitting:   100 / 70  (left arm) Cuff size:   regular  Vitals Entered By: Benny Lennert CMA Duncan Dull) (March 17, 2010 7:58 AM)  History of Present Illness: Chief complaint ? sinus infection  50 year old female:  Mucinex, decongestant.  Gets a lot of sinus infections.   Acute Visit History:      The patient complains of headache, nasal discharge, and sinus problems.  These symptoms began 1 week ago.  She denies sore throat and vomiting.        She complains of sinus pressure, teeth aching, ears being blocked, nasal congestion, purulent drainage, and frontal headache.  The patient has had a past history of sinusitis.        Urine output has been normal.  She is tolerating clear liquids.        Allergies: 1)  Biaxin (Clarithromycin)  Past History:  Past medical, surgical, family and social histories (including risk factors) reviewed, and no changes noted (except as noted below).  Past Medical History: Reviewed history from 09/26/2007 and no changes required.   MASS, AXILLA (ICD-782.2) DYSPNEA (ICD-786.05) URI (ICD-465.9) FATIGUE (ICD-780.79) SINUSITIS- ACUTE-NOS (ICD-461.9) neg sinus ct 08/14/07 ANXIETY (ICD-300.00) SCREENING FOR LIPOID DISORDERS (ICD-V77.91) INSOMNIA, CHRONIC (ICD-307.42) RUQ PAIN (ICD-789.01) CONSTIPATION NOS (ICD-564.00) DEPRESSION (ICD-311) Cough   - Dec 08 onset   - 08/14/07 Sinus CT completely nl    Past Surgical History: Reviewed history from 07/19/2006 and no changes required. 07-31-2003  bladder sling 1988 C Section 04/2005 breast aug, mini tummy tuck  Family History: Reviewed history from 09/26/2007 and no changes required. father age 54  high chol, CAD s/p stent depression mother age 62 stomach/intestine   issues 2 sisters/1 brother endometriosis, stomache issues family history of gallstone aunt:colon cancer neg resp dz/ atopy  Social History: Reviewed history from 05/14/2007 and no changes required. Occupation:  Airline pilot Married Never Smoked Alcohol use-yes  3-4 glassses per week Regular exercise-no, walks some Diet: veggies, fruit, lean meats,  avoids fried foods Son died in 07/30/2005  Review of Systems       REVIEW OF SYSTEMS GEN: Acute illness details above. CV: No chest pain or SOB GI: No noted N or V Otherwise, pertinent positives and negatives are noted in the HPI.   Physical Exam  Additional Exam:  Gen: WDWN, NAD; alert,appropriate and cooperative throughout exam  HEENT: Normocephalic and atraumatic. Throat clear, w/o exudate, no LAD, R TM clear, L TM - good landmarks, No fluid present. rhinnorhea.  Left frontal and maxillary sinuses: Tender at m Right frontal and maxillary sinuses: Tender at m  Neck: No ant or post LAD  CV: RRR, No M/G/R  Pulm: Breathing comfortably in no resp distress. no w/c/r  Abd: S,NT,ND,+BS  Extr: no c/c/e  Psych: full affect, pleasant    Impression & Recommendations:  Problem # 1:  SINUSITIS - ACUTE-NOS (ICD-461.9) Assessment New  Her updated medication list for this problem includes:    Amoxicillin 875 Mg Tabs (Amoxicillin) .Marland Kitchen... 1 by mouth two times a day  Instructed on treatment. Call if symptoms persist or worsen.   Complete Medication List: 1)  Multivitamins  Tabs (Multiple vitamin) .... Takes one tab daily 2)  Antivert 25 Mg Tabs (Meclizine hcl) .... Use as directed 3)  Sertraline Hcl 25 Mg Tabs (Sertraline hcl) .... Take 1 tablet by mouth once a day 4)  Vitamin B-12 1000 Mcg Tabs (Cyanocobalamin) .... Take one tablet daily 5)  Amoxicillin 875 Mg Tabs (Amoxicillin) .Marland Kitchen.. 1 by mouth two times a day Prescriptions: AMOXICILLIN 875 MG TABS (AMOXICILLIN) 1 by mouth two times a day  #20 x 0   Entered and Authorized by:   Hannah Beat MD   Signed by:   Hannah Beat MD on 03/17/2010   Method used:   [facility] to        [facility] [id number]       [address]       [place], [place]  [address]       Ph: [phone] or [phone]       Fax: [phone]   RxID:   [id number]    Orders Added: 1)  Est. Patient Level III [36644]    Current Allergies (reviewed today): BIAXIN (CLARITHROMYCIN)

## 2010-04-16 ENCOUNTER — Encounter: Payer: Self-pay | Admitting: Family Medicine

## 2010-04-16 ENCOUNTER — Ambulatory Visit (INDEPENDENT_AMBULATORY_CARE_PROVIDER_SITE_OTHER): Payer: BC Managed Care – PPO | Admitting: Family Medicine

## 2010-04-16 DIAGNOSIS — M542 Cervicalgia: Secondary | ICD-10-CM

## 2010-04-20 NOTE — Assessment & Plan Note (Signed)
Summary: HEAD,NECK PAIN/CLE   [employer]   Vital Signs:  Patient profile:   50 year old female Height:      65.25 inches Weight:      167.75 pounds BMI:     27.80 Temp:     98.8 degrees F oral Pulse rate:   80 / minute Pulse rhythm:   regular BP sitting:   126 / 76  (left arm) Cuff size:   regular  Vitals Entered By: Delilah Shan CMA Duncan Dull) (April 16, 2010 2:17 PM) CC: Head, neck pain   History of Present Illness: Neck pain.  "I thought it was a crick in my neck yesterday."  Inc in pain since yesterday.  Pain in the back of the neck and radiating above the R ear.  She can move her neck but it hurst to do so.  She feels pulling with yawning.  She took some phenergan this AM for nausea.  She thinks the nausea was coming from the pain- this has happened before for her.  No L sided symptoms.  No fevers.  no cough, rhinorrhea, diarrhea.  No change in pillows.  No MVA, no trauma.  No help with aleve/heat.  No weakness, no numbness.    Allergies: 1)  Biaxin (Clarithromycin)  Past History:  Past Medical History:  MASS, AXILLA (ICD-782.2) DYSPNEA (ICD-786.05) URI (ICD-465.9) FATIGUE (ICD-780.79) SINUSITIS- ACUTE-NOS (ICD-461.9) neg sinus ct 08/14/07 ANXIETY (ICD-300.00) SCREENING FOR LIPOID DISORDERS (ICD-V77.91) INSOMNIA, CHRONIC (ICD-307.42) RUQ PAIN (ICD-789.01) CONSTIPATION NOS (ICD-564.00) DEPRESSION (ICD-311) Cough   - Dec 08 onset   - 08/14/07 Sinus CT completely nl    Review of Systems       See HPI.  Otherwise negative.    Physical Exam  General:  no apparent distress normocephalic atraumatic tm wnl, op wnl, nasal exam wnl perrl, eomi neck with no anterior pain or LA no posterior midline pain.  R paraspinal C spine muscles tender to palpation.  not tender to palpation on L side.  range of motion limited by pain/tightness on R C spine muscles but o/w intact regular rate and rhythm clear to auscultation bilaterally    Impression & Recommendations:  Problem # 1:   NECK PAIN, RIGHT (ICD-723.1) muscle spams.  No sign of another ominous dx.  normal CN 2-12 and s/s/dtr wnl.  Use muscle relaxer and local heat/stretching and follow up as needed.  No indication for imaging. GI caution with NSAIDS.  Her updated medication list for this problem includes:    Flexeril 10 Mg Tabs (Cyclobenzaprine hcl) .Marland Kitchen... 1/2 to 1 tab three times a day with sedation caution for muscle spasm  Complete Medication List: 1)  Multivitamins Tabs (Multiple vitamin) .... Takes one tab daily 2)  Antivert 25 Mg Tabs (Meclizine hcl) .... Use as directed 3)  Sertraline Hcl 25 Mg Tabs (Sertraline hcl) .... Take 1 tablet by mouth once a day 4)  Vitamin B-12 1000 Mcg Tabs (Cyanocobalamin) .... Take one tablet daily 5)  Flexeril 10 Mg Tabs (Cyclobenzaprine hcl) .... 1/2 to 1 tab three times a day with sedation caution for muscle spasm  Patient Instructions: 1)  Take the flexeril as needed.  It can make you drowsy. 2)  Take ibuprofen 800mg  by mouth three times a day with food for the pain.   3)  Use a heating pad on your neck.  4)  Let us know if you aren't getting better next week.  Prescriptions: FLEXERIL 10 MG TABS (CYCLOBENZAPRINE HCL) 1/2 to 1 tab three  times a day with sedation caution for muscle spasm  #30 x 0   Entered and Authorized by:   Crawford Givens MD   Signed by:   Crawford Givens MD on 04/16/2010   Method used:   [facility]        [facility]       [address]       [place], [place]  [address]       Ph: [phone] or [phone]       Fax: [phone]   RxID:   [id number]    Orders Added: 1)  Est. Patient Level III [64403]    Current Allergies (reviewed today): BIAXIN (CLARITHROMYCIN)

## 2010-06-24 ENCOUNTER — Encounter: Payer: Self-pay | Admitting: Family Medicine

## 2010-06-28 ENCOUNTER — Ambulatory Visit (INDEPENDENT_AMBULATORY_CARE_PROVIDER_SITE_OTHER): Payer: BC Managed Care – PPO | Admitting: Family Medicine

## 2010-06-28 ENCOUNTER — Encounter: Payer: Self-pay | Admitting: Family Medicine

## 2010-06-28 DIAGNOSIS — M79601 Pain in right arm: Secondary | ICD-10-CM | POA: Insufficient documentation

## 2010-06-28 DIAGNOSIS — M25511 Pain in right shoulder: Secondary | ICD-10-CM | POA: Insufficient documentation

## 2010-06-28 DIAGNOSIS — M25519 Pain in unspecified shoulder: Secondary | ICD-10-CM

## 2010-06-28 DIAGNOSIS — M79609 Pain in unspecified limb: Secondary | ICD-10-CM

## 2010-06-28 MED ORDER — DICLOFENAC SODIUM 75 MG PO TBEC: 75.0000 mg | DELAYED_RELEASE_TABLET | Freq: Two times a day (BID) | ORAL | Status: DC

## 2010-06-28 NOTE — Assessment & Plan Note (Signed)
Most consistent with rotator cuff tendonoitis , no sign of muscle tear. Start ice and NSAIDS. Follow up for steroid injection if not improving.

## 2010-06-28 NOTE — Progress Notes (Signed)
Subjective:    Patient ID: Cassandra Wise, female    DOB: [birth year], 50 y.o.   MRN: [id number]  Arm Pain  Pertinent negatives include no chest pain.    50 year old female presents with pain in right shoulder and arm pain x months. Off and on for 3 months. Cannot sleep on right side. Pain in shoulder  with reaching back behind head po0r with trying to put arm in shirt. No pain with abduction .. Mainly with shoulder ext rotation. Occ tingling in right arm. But no numbness in fingers. Pain is constant in right lower arm. Pain in arm if rests her elbow on hard surface.   Arm and hand gets fatigued with drying hair or holding things.  Was seen in 03/2010 with neck pain that resolved with time and muscle relaxant. Right neck stiff ... No shooting pain when moving neck. Does do computer work daily.  Has been trying to do stretching exercises. Occ using muscle relaxant .Marland Kitchen Did not help much. Has been exercsing more , push ups...she has since stopped this.  No falls recently.  Review of Systems  Respiratory: Negative for shortness of breath.   Cardiovascular: Negative for chest pain.  Musculoskeletal: Negative for joint swelling, arthralgias and gait problem.  Neurological: Positive for weakness. Negative for dizziness, seizures, syncope, facial asymmetry and headaches.       Objective:   Physical Exam  Constitutional: Vital signs are normal. She appears well-developed and well-nourished. She is cooperative.  Non-toxic appearance. She does not appear ill. No distress.  HENT:  Head: Normocephalic.  Right Ear: Hearing, tympanic membrane, external ear and ear canal normal. Tympanic membrane is not erythematous, not retracted and not bulging.  Left Ear: Hearing, tympanic membrane, external ear and ear canal normal. Tympanic membrane is not erythematous, not retracted and not bulging.  Nose: No mucosal edema or rhinorrhea. Right sinus exhibits no maxillary sinus tenderness and no  frontal sinus tenderness. Left sinus exhibits no maxillary sinus tenderness and no frontal sinus tenderness.  Mouth/Throat: Uvula is midline, oropharynx is clear and moist and mucous membranes are normal.  Eyes: Conjunctivae, EOM and lids are normal. Pupils are equal, round, and reactive to light. No foreign bodies found.  Neck: Trachea normal, normal range of motion and full passive range of motion without pain. Neck supple. No spinous process tenderness present. Carotid bruit is not present. No rigidity. No mass and no thyromegaly present.       Neg spurling's test  Cardiovascular: Normal rate, regular rhythm, S1 normal, S2 normal, normal heart sounds, intact distal pulses and normal pulses.  Exam reveals no gallop and no friction rub.   No murmur heard. Pulmonary/Chest: Effort normal and breath sounds normal. Not tachypneic. No respiratory distress. She has no decreased breath sounds. She has no wheezes. She has no rhonchi. She has no rales.  Abdominal: Soft. Normal appearance and bowel sounds are normal. There is no tenderness.  Musculoskeletal:       Right shoulder: She exhibits decreased range of motion and pain. She exhibits no effusion, no crepitus, no deformity, no spasm and normal strength.       Right elbow: She exhibits normal range of motion and no effusion. tenderness found. No radial head, no medial epicondyle, no lateral epicondyle and no olecranon process tenderness noted.       Right wrist: She exhibits tenderness. She exhibits no swelling.       RIGHT SHOULDER: Positive right impingement sign, pain with internal and external  rotation of right shoulder Neg drop arm test Tenderness ..right  Anterior subacromial region RIGHT WRIST: Right neg Tinel and Phalen, focal pain in right wrist at carpal tunnels though POSITIVE right ulnar nerve compression causing pain ans tingling       Neurological: She is alert.  Skin: Skin is warm, dry and intact. No rash noted.  Psychiatric: Her  speech is normal and behavior is normal. Judgment and thought content normal. Her mood appears not anxious. Cognition and memory are normal. She does not exhibit a depressed mood.          Assessment & Plan:

## 2010-06-28 NOTE — Patient Instructions (Signed)
Start diclofenac BID x 2 weeks. Start home exercises.  Review information given. Limit heavy lifting as in information given.

## 2010-06-28 NOTE — Assessment & Plan Note (Signed)
PE suggests ulnar neuropathy. No suggestion of central spinal cord issue.  Avoid pressure in right elbow and repetitive use. Wrist pain may be due to  Ulnar nerve irritation...but if not improving as expected consider nerve conduction to eval further.

## 2010-07-05 ENCOUNTER — Other Ambulatory Visit: Payer: Self-pay | Admitting: Family Medicine

## 2010-07-06 NOTE — Assessment & Plan Note (Signed)
NAME:  VALINCIA, TOUCH NO.:  [id number]   MEDICAL RECORD NO.:  [id number]          PATIENT TYPE:  POB   LOCATION:  [facility]         FACILITY:  [name]   PHYSICIAN:  Argentina Donovan, MD        DATE OF BIRTH:  12-27-1960   DATE OF SERVICE:                                  CLINIC NOTE   The patient is a 50 year old Caucasian female who lost her only child, a  son, to suicide 2 years ago.  She has been in group therapy and  counseling since that time.  She comes in to see [place] today because she  has had significant mood swings throughout the month with irregularity  in her period starting and mood swings seem much worse when she is  having a period.  Her husband has been complaining.  She is exercising.  She has been losing weight and she feels that she has the psychological  problem that she had before pretty well under control.  She is a  nonsmoker with a normal blood pressure 136/78 and weighs 163 pounds, is  5 feet 5 inches tall, and we have talked to her about using YAZ to  control the periods and the bloating and breast tenderness that she has  and the irregularity in her menses.  I think that this will probably  control that, whether it will completely control the PMDD symptoms that  she seems to be experiencing in this perimenopausal time I am not sure.  I told her to start on that and if we needed to add Prozac for short  periods of time we could do that so we have given her 3 samples of boxes  of YAZ.  She is going to touch base with me after a month or two and let  me know how that has been working for her.   IMPRESSION:  Severe perimenopausal symptoms.           ______________________________  Argentina Donovan, MD     PR/MEDQ  D:  01/08/2008  T:  01/09/2008  Job:  [id number]

## 2010-07-09 NOTE — Discharge Summary (Signed)
NAME:  Cassandra Wise, Cassandra Wise         ACCOUNT NO.:  [id number]   MEDICAL RECORD NO.:  [id number]          PATIENT TYPE:  OBV   LOCATION:  9319                          FACILITY:  [facility]   PHYSICIAN:  Ginger Carne, MD  DATE OF BIRTH:  07-23-1960   DATE OF ADMISSION:  01/23/2004  DATE OF DISCHARGE:                                 DISCHARGE SUMMARY   FINAL DIAGNOSES:  1.  Genuine urinary stress incontinence.  2.  Cystocele.   IN-HOSPITAL PROCEDURES:  1.  Tension-free vaginal tape procedure with polypropylene.  2.  Cystoscopy.  3.  Anterior colporrhaphy.   HOSPITAL COURSE:  This is a 50 year old Caucasian female admitted for the  aforementioned procedures on January 23, 2004.  Postoperative course was  uneventful.  She was afebrile.  At the time of dictation, she had no voided  after Foley catheter removal.  It had only been 2-1/2 hours, however.  She  has no vaginal drainage or bleeding and her lower pelvic incisions are dry.  Lungs are clear and calves are without tenderness.   Instructions provided to patient including information regarding voiding  about every 4-6 hours and once at night.  Avoiding caffeine and heavy fluid  intakes in the evening.  The patient was also advised to avoid constipation  and intercourse should be avoided for 4 weeks.  The patient was prescribed  Vicodin 1-2 every 4-6 hours as necessary for pain.  She is completing a  course of Augmentin 500 mg twice a day for sinus infection and will continue  this for the next 3 days.  The patient will return in 4 weeks.  She was  advised that if her catheter has to be replaced with a leg bag prior to  leaving because of inability to void, it will be removed Monday morning in 2  days.  All questions answered.  She was also advised to contact the office  for temperature elevation above 100.4 degrees Fahrenheit, increasing pelvic  pain, or increasing vaginal discharge.     Stev   SHB/MEDQ  D:  01/24/2004  T:   01/25/2004  Job:  [id number]

## 2010-07-09 NOTE — Op Note (Signed)
NAME:  Cassandra Wise, LAURSEN         ACCOUNT NO.:  [id number]   MEDICAL RECORD NO.:  [id number]          PATIENT TYPE:  AMB   LOCATION:  [facility]                           FACILITY:  [facility]   PHYSICIAN:  Ginger Carne, MD  DATE OF BIRTH:  04-Aug-1960   DATE OF PROCEDURE:  01/23/2004  DATE OF DISCHARGE:                                 OPERATIVE REPORT   PREOPERATIVE DIAGNOSES:  1.  Genuine stress incontinence.  2.  Second cystocele.   POSTOPERATIVE DIAGNOSES:  1.  Genuine stress incontinence.  2.  Second cystocele.   PROCEDURE:  Tension free vaginal tape procedure with cystoscopy and anterior  colporrhaphy.   SURGEON:  Ginger Carne, MD.   ASSISTANT:  Raynald Kemp, MD   ESTIMATED BLOOD LOSS:  Less than 25 mL.   COMPLICATIONS:  None immediate.   ANESTHESIA:  General.   SPECIMENS:  None.   FINDINGS:  A second degree cystocele noted. Cystoscopy demonstrated no  injury to the bladder after placement of trocars in the retropubic space.  Trigone identified, both ureteral orifice sites identified.  The bladder was  visualized in all four quadrants including the dome.   DESCRIPTION OF PROCEDURE:  The patient prepped and draped in the usual  fashion and placed in lithotomy position.  Betadine solution used for  antiseptic and the patient was catheterized prior to the procedure.  After  adequate general anesthesia, approximately 20 mL of Marcaine with  epinephrine was injected beneath the anterior vaginal epithelium. At this  point, a midline incision was made for about 4 cm along the anterior vaginal  wall midline. The pubovesical cervical fascia was dissected bilaterally.  The space of Retzius was then entered on either side by a bottom up  technique utilizing a Gynecare polypropylene tape system. The trocars were  then exiting from the previously made lower abdominal incision about a  centimeter from the midline on either side.  Cystoscopy followed utilizing  approximately 3 to  400 mL, the aforementioned findings noted.   Afterwards, the tape was tensioned to 250 mL of normal saline in the  bladder, the ends trimmed and the skin edges closed with Dermabond.  A  traditional anterior colporrhaphy was performed by reapproximating in the  midline the  pubovesical cervical fascia with a series of #0 Monocryl sutures. At this  point, closure of the vaginal epithelium without trimming was performed  using running #0 Monocryl interlocking suture. The patient tolerated the  procedure well and returned to the post anesthesia recovery room in  excellent condition.     Stev   SHB/MEDQ  D:  01/23/2004  T:  01/23/2004  Job:  [id number]

## 2010-07-09 NOTE — H&P (Signed)
NAME:  Cassandra Wise, Cassandra Wise         ACCOUNT NO.:  [id number]   MEDICAL RECORD NO.:  [id number]          PATIENT TYPE:  AMB   LOCATION:  [facility]                           [facility]   PHYSICIAN:  Ginger Carne, MD  DATE OF BIRTH:  [birth year]   DATE OF ADMISSION:  01/23/2004  DATE OF DISCHARGE:                                HISTORY & PHYSICAL   ADMISSION DIAGNOSIS:  Urodynamic stress incontinence and second degree  cystocele.   IN-HOSPITAL PROCEDURES:  Tension-free vaginal tape, cystoscopy, and anterior  colporrhaphy.   HISTORY OF PRESENT ILLNESS:  This patient is a 50 year old gravida 1, para 1-  0-0-1, Caucasian female admitted with a diagnosis of urodynamic stress  incontinence and a second degree cystocele.  The patient has had a worsening  problem of loss of urine spontaneously as well as with stress factors.  This  has occurred over the past several years but has worsened over the past six  months.  She loses urine with straining, coughing, and other Valsalva  maneuvers.  She denies increased frequency, nocturia, urgency, dysuria, post-  micturition dribbling, ,or a sensation of incomplete emptying.  She does not  have an intermittent stream and does not have to strain to void.  She has  not had a history of urological surgery and has not had a history of  recurrent urinary tract infections of pyelonephritis.  She has had no  previous bladder repairs or vaginal surgery, denies fecal incontinence, and  does not take any medications and has no known medical diseases that would  enhance her propensity for losing urine.  At the time the patient was  evaluated in early summer of 2005, there was a component of an unstable  bladder.  Detrol LA was utilized for two months, which demonstrated minimal  benefit.   OBSTETRIC/GYNECOLOGIC HISTORY:  The patient has had one cesarean section in  1988, and method of contraception, her husband has had a vasectomy.   CURRENT ALLERGIES:   None.   MEDICATIONS:  Calcium, zinc, vitamins.   PAST SURGICAL HISTORY:  Gum surgery in the past.   PAST MEDICAL HISTORY:  History of migraine headaches, which occurred between  1988 and 1993 but have not recurred recently.   SOCIAL HISTORY:  Negative for smoking, illicit drug abuse, or alcohol abuse.   FAMILY HISTORY:  No evidence of first degree relatives having breast, colon,  ovarian, or uterine carcinoma.  Her father has had a history of hypertension  and coronary artery disease.  Her mother is hypertensive as well.   PHYSICAL EXAMINATION:  VITAL SIGNS:  Height is 5 feet 4 inches, weight 157  pounds.  Blood pressure 118/60.  HEENT:  Grossly normal.  BREASTS:  Without masses, discharge, thickenings, or tenderness bilaterally.  CHEST:  Clear to percussion and auscultation.  CARDIOVASCULAR:  Without murmurs or enlargements, regular rate and rhythm.  VASCULAR, EXTREMITY, LYMPHATIC, SKIN, NEUROLOGIC, MUSCULOSKELETAL:  Within  normal limits.  ABDOMEN:  Soft without gross hepatosplenomegaly.  PELVIC:  External genitalia, vulva, and vagina normal.  Cervix smooth  without erosions or lesions.  Uterus is small, anteverted and flexed.  A  second degree cystocele is noted without evidence of a rectocele prolapse,  or enterocele.  Both adnexa are palpable, found to be normal.  RECTAL:  Hemoccult-negative without masses.   Pap smear is normal.  Urodynamic testing:  The patient demonstrates a  residual urine volume of 20 mL.  Other cystometric evaluation reveals  minimal evidence for detrusor instability.  There is evidence for urine loss  with a Valsalva maneuver.  There is no loss of urine with straining after an  empty bladder.  Q-Tip angle is 45 degrees but does not demonstrate  significant hypermobility.  The patient's S2, S3, S4 perineal reflexes are  normal.  Anal sphincter tone is normal.  The urethral appears to be normal.  Perineum normal.  No evidence of vaginal or vulvar  atrophy.   IMPRESSION:  1.  Urodynamic stress incontinence with minimal evidence for an overactive      bladder.  2.  Second degree cystocele.   PLAN:  The patient has attempted Kegel exercises without benefits.  Detrol  LA has not been very useful.  The patient believe that loss of urine has had  both personal and social consequences.  The nature of a tension-free vaginal  tape procedure was discussed in detail with the patient, including risks and  benefits, and including the nature of said procedure with a cystoscopy and  an anterior colporrhaphy.  Postoperative complications discussed include  postoperative urinary retention, urgency, urinary infection, bleeding, wound  infection, graft rejection, and possible recurrence of urinary incontinence.  The patient was explained that over seven to 10-year trials, the failure  rate is approximately 10% over time.  The patient is scheduled for said  surgery on December 2.     Stev   SHB/MEDQ  D:  01/21/2004  T:  01/21/2004  Job:  [id number]

## 2010-07-09 NOTE — Op Note (Signed)
NAME:  Cassandra Wise, Cassandra Wise         ACCOUNT NO.:  [id number]   MEDICAL RECORD NO.:  [id number]          PATIENT TYPE:  AMB   LOCATION:  SDC                           FACILITY:  [facility]   PHYSICIAN:  Ginger Carne, MD  DATE OF BIRTH:  [birth year]   DATE OF PROCEDURE:  01/27/2004  DATE OF DISCHARGE:                                 OPERATIVE REPORT   PREOPERATIVE DIAGNOSIS:  Postoperative urinary retention.   POSTOPERATIVE DIAGNOSIS:  Postoperative urinary retention.   PROCEDURE:  Revision of anterior colporrhaphy.   SURGEON:  Ginger Carne, M.D.   COMPLICATIONS:  None immediate.   ESTIMATED BLOOD LOSS:  Less than 25 mL.   SPECIMENS:  None.   ANESTHESIA:  General.   OPERATIVE FINDINGS:  One suture was removed that approximated the  pubovesical cervical fascia at the urethral vesical junction which appeared  to be tight.  The Prolene suture after release provided relaxation of said  junction.  The tension free tape polypropylene material was not tight  against the mid urethra.   DESCRIPTION OF PROCEDURE:  The patient was prepped and draped in the  lithotomy position.  Betadine solution was used for antiseptic, and the  patient was catheterized prior to the procedure.  After adequate general  anesthesia, the anterior vaginal epithelium was opened by removing running  sutures.  Identification of said Prolene suture was used to approximate the  pubovesical cervical fascia at the urethral vesical junction was removed.  The tension free vaginal tape was adjusted but left in situ.  Copious  irrigation with lactated Ringers followed.  Bleeding points were  meticulously hemostatically checked.  I closed the vaginal epithelium with 2-  0 Monocryl interrupted sutures.  No bleeding noted at the end of the  procedure.  The patient tolerated the procedure well and returned to the  post anesthesia recovery in excellent condition.  A Foley catheter with a  leg bag was left in  situ.     Stev   SHB/MEDQ  D:  01/27/2004  T:  01/28/2004  Job:  [id number]

## 2010-07-15 ENCOUNTER — Telehealth: Payer: Self-pay | Admitting: *Deleted

## 2010-07-15 NOTE — Telephone Encounter (Signed)
Call  Constipation unlikely from diclofenac  I would not take bid indefinitely, but it is relatively safe and can be taken intermittently if she is having a flare-up or pain

## 2010-07-15 NOTE — Telephone Encounter (Signed)
Patient advised.

## 2010-07-15 NOTE — Telephone Encounter (Signed)
Pt has been taking diclfenac and this has helped with her shoulder pain but she is asking if it can cause constipation.  She has had some problems with that over the last couple of weeks.  Also she is asking if she is to continue taking this, how long can she stay on it?

## 2010-10-13 ENCOUNTER — Encounter: Payer: Self-pay | Admitting: Family Medicine

## 2010-10-13 ENCOUNTER — Ambulatory Visit (INDEPENDENT_AMBULATORY_CARE_PROVIDER_SITE_OTHER): Payer: BC Managed Care – PPO | Admitting: Family Medicine

## 2010-10-13 VITALS — BP 126/72 | HR 76 | Temp 98.1°F | Ht 64.0 in | Wt 170.5 lb

## 2010-10-13 DIAGNOSIS — R05 Cough: Secondary | ICD-10-CM

## 2010-10-13 MED ORDER — SULFAMETHOXAZOLE-TMP DS 800-160 MG PO TABS: 1.0000 | ORAL_TABLET | Freq: Two times a day (BID) | ORAL | Status: AC

## 2010-10-13 MED ORDER — HYDROCOD POLST-CHLORPHEN POLST 10-8 MG/5ML PO LQCR: 5.0000 mL | Freq: Every evening | ORAL | Status: DC | PRN

## 2010-10-13 NOTE — Progress Notes (Signed)
  Subjective:    Patient ID: Cassandra Wise, female    DOB: 18-Feb-1961, 50 y.o.   MRN: [id number]  HPI CC: cough, no energy  Last week in [place] for business.  Then went to [facility].  Started Thursday (6d ago) with PND and drainage and laryngitis.  Over weekend, no energy, stuffy feeling, drained.  Then 3d h/o deep cough causing bladder accidents, post-tussive emesis. Yesterday may have been feverish.  Feeling sinus congestion, but not pressure.  Also feeling nauseated.  Some body aches.  Wonders how much jet lag contributing.  Main concern is dry cough.  + sweaty.  Has tried zyrtec, ibuprofen.  Coughing fits worse at night.  No abd pain, diarrhea, rashes, myalgias, arthralgias.  No chest pain or SOB.  No sick contacts at home.  Not around children<1yo, no smokers at home.  No h/o asthma/COPD.  Review of Systems Per HPI    Objective:   Physical Exam  Nursing note and vitals reviewed. Constitutional: She appears well-developed and well-nourished. No distress.       nontoxic  HENT:  Head: Normocephalic and atraumatic.  Right Ear: Hearing, tympanic membrane, external ear and ear canal normal.  Left Ear: Hearing, tympanic membrane, external ear and ear canal normal.  Nose: No mucosal edema or rhinorrhea.  Mouth/Throat: Uvula is midline, oropharynx is clear and moist and mucous membranes are normal. No oropharyngeal exudate, posterior oropharyngeal edema, posterior oropharyngeal erythema or tonsillar abscesses.       Minimal sinus pain bilaterally (frontal and maxillary)  Eyes: Conjunctivae and EOM are normal. Pupils are equal, round, and reactive to light. No scleral icterus.  Neck: Normal range of motion. Neck supple. No JVD present. No thyromegaly present.  Cardiovascular: Normal rate, regular rhythm, normal heart sounds and intact distal pulses.   No murmur heard. Pulmonary/Chest: Effort normal and breath sounds normal. No respiratory distress. She has no wheezes. She has no rales.    Lymphadenopathy:    She has no cervical adenopathy.  Skin: Skin is warm and dry. No rash noted.  Psychiatric: She has a normal mood and affect.          Assessment & Plan:

## 2010-10-13 NOTE — Assessment & Plan Note (Signed)
Given associated with feverish feeling, post tussive emesis, and diaphoresis, and recent airline travel, will cover for pertussis. clarithromycin allergy - so treat with bactrim x 2 wks. Encouraged push yogurt during abx treatment, encouraged fluids and rest. tussionex for cough at night. Update [place] if not improving as expected. Lungs clear today.

## 2010-10-13 NOTE — Patient Instructions (Addendum)
Could be just viral infection, or bronchitis. Will cover for whooping cough and other bronchitis with bactrim twice daily for 14 days. tussionex for cough at night time. If not improving with this regimen or any worsening, let us know. Push fluids and plenty of rest.

## 2010-11-09 ENCOUNTER — Other Ambulatory Visit (INDEPENDENT_AMBULATORY_CARE_PROVIDER_SITE_OTHER): Payer: BC Managed Care – PPO

## 2010-11-09 DIAGNOSIS — E7800 Pure hypercholesterolemia, unspecified: Secondary | ICD-10-CM

## 2010-11-09 LAB — CBC WITH DIFFERENTIAL/PLATELET
Basophils Absolute: 0 10*3/uL (ref 0.0–0.1)
HCT: 37.5 % (ref 36.0–46.0)
Lymphs Abs: 1.4 10*3/uL (ref 0.7–4.0)
MCV: 92.1 fl (ref 78.0–100.0)
Monocytes Absolute: 0.5 10*3/uL (ref 0.1–1.0)
Platelets: 344 10*3/uL (ref 150.0–400.0)
RDW: 14.7 % — ABNORMAL HIGH (ref 11.5–14.6)

## 2010-11-09 LAB — COMPREHENSIVE METABOLIC PANEL
ALT: 19 U/L (ref 0–35)
Albumin: 3.9 g/dL (ref 3.5–5.2)
Alkaline Phosphatase: 88 U/L (ref 39–117)
CO2: 28 mEq/L (ref 19–32)
GFR: 85.52 mL/min (ref >60.00)
Potassium: 4.2 mEq/L (ref 3.5–5.1)
Sodium: 139 mEq/L (ref 135–145)
Total Bilirubin: 0.5 mg/dL (ref 0.3–1.2)
Total Protein: 7.2 g/dL (ref 6.0–8.3)

## 2010-11-09 LAB — LIPID PANEL
Total CHOL/HDL Ratio: 3
Triglycerides: 72 mg/dL (ref 0.0–149.0)

## 2010-11-09 LAB — LDL CHOLESTEROL, DIRECT: Direct LDL: 144.2 mg/dL

## 2010-11-12 ENCOUNTER — Encounter: Payer: Self-pay | Admitting: Family Medicine

## 2010-11-12 ENCOUNTER — Other Ambulatory Visit (HOSPITAL_COMMUNITY)
Admission: RE | Admit: 2010-11-12 | Discharge: 2010-11-12 | Disposition: A | Discharge status: Home / Self Care | Payer: BC Managed Care – PPO | Source: Ambulatory Visit | Attending: Family Medicine | Admitting: Family Medicine

## 2010-11-12 ENCOUNTER — Ambulatory Visit (INDEPENDENT_AMBULATORY_CARE_PROVIDER_SITE_OTHER): Payer: BC Managed Care – PPO | Admitting: Family Medicine

## 2010-11-12 VITALS — BP 120/84 | HR 65 | Temp 98.4°F | Ht 64.0 in | Wt 170.0 lb

## 2010-11-12 DIAGNOSIS — G4700 Insomnia, unspecified: Secondary | ICD-10-CM

## 2010-11-12 DIAGNOSIS — Z01419 Encounter for gynecological examination (general) (routine) without abnormal findings: Secondary | ICD-10-CM

## 2010-11-12 DIAGNOSIS — Z1211 Encounter for screening for malignant neoplasm of colon: Secondary | ICD-10-CM

## 2010-11-12 DIAGNOSIS — K5900 Constipation, unspecified: Secondary | ICD-10-CM

## 2010-11-12 DIAGNOSIS — F329 Major depressive disorder, single episode, unspecified: Secondary | ICD-10-CM

## 2010-11-12 DIAGNOSIS — Z1159 Encounter for screening for other viral diseases: Secondary | ICD-10-CM | POA: Insufficient documentation

## 2010-11-12 DIAGNOSIS — J984 Other disorders of lung: Secondary | ICD-10-CM

## 2010-11-12 DIAGNOSIS — E7800 Pure hypercholesterolemia, unspecified: Secondary | ICD-10-CM

## 2010-11-12 DIAGNOSIS — F3289 Other specified depressive episodes: Secondary | ICD-10-CM

## 2010-11-12 DIAGNOSIS — Z0000 Encounter for general adult medical examination without abnormal findings: Secondary | ICD-10-CM

## 2010-11-12 DIAGNOSIS — Z1231 Encounter for screening mammogram for malignant neoplasm of breast: Secondary | ICD-10-CM

## 2010-11-12 MED ORDER — VITAMIN B-12 1000 MCG PO TABS: 1000.0000 ug | ORAL_TABLET | Freq: Every day | ORAL | Status: DC

## 2010-11-12 MED ORDER — TRIAMCINOLONE ACETONIDE 0.5 % EX CREA: TOPICAL_CREAM | Freq: Two times a day (BID) | CUTANEOUS | Status: DC

## 2010-11-12 NOTE — Patient Instructions (Addendum)
Start red yeast rice 2400 mg divided daily. Continue working on lifestyle changes.  Stop by front to speak with Shirlee Limerick. If interested in derm eval.. Consider Dr. Terri Piedra.  Recheck cholesterol in 3 months, lab only appt. CPX in 1 year.

## 2010-11-12 NOTE — Assessment & Plan Note (Signed)
Resolved with  Diet changes.

## 2010-11-12 NOTE — Assessment & Plan Note (Signed)
Well controlled. Continue current medication.  

## 2010-11-12 NOTE — Assessment & Plan Note (Signed)
Inadequate control on max lifestyle change per pt. Start red yeast rice 2400 mg daily

## 2010-11-12 NOTE — Progress Notes (Signed)
Subjective:    Patient ID: Cassandra Wise, female    DOB: [birth year], 50 y.o.   MRN: [id number]  HPI  The patient is here for annual wellness exam and preventative care.    Depression, anxiety:   Stable control on sertraline 25 mg daily Insomnia:Moderate control Has started black cohosh for menopausal symptoms 9night sweats)  Menses irregular and only lasting 1 day.  Elevated Cholesterol: Inadequate control. Goal LDL <130. Using medications without problems: Muscle aches:  Other complaints: \Walking twice a week, does zoomba.  Eating really healthy low chol foods.  Noted skin lesion on left shoulder blade in last week. Mild to moderate itchy. Feels bumpy. Noted after being at beach. She has not used any med for it.     Review of Systems  Constitutional: Negative for fever, fatigue and unexpected weight change.  HENT: Negative for ear pain, congestion, sore throat, sneezing, trouble swallowing and sinus pressure.   Eyes: Negative for pain and itching.  Respiratory: Negative for cough, chest tightness, shortness of breath and wheezing.   Cardiovascular: Negative for chest pain, palpitations and leg swelling.  Gastrointestinal: Negative for nausea, abdominal pain, diarrhea, constipation and blood in stool.  Genitourinary: Negative for dysuria, hematuria, vaginal discharge, difficulty urinating and menstrual problem.  Skin: Negative for rash.  Neurological: Negative for syncope, weakness, light-headedness, numbness and headaches.  Psychiatric/Behavioral: Negative for confusion and dysphoric mood. The patient is not nervous/anxious.        Objective:   Physical Exam  Constitutional: Vital signs are normal. She appears well-developed and well-nourished. She is cooperative.  Non-toxic appearance. She does not appear ill. No distress.  HENT:  Head: Normocephalic.  Right Ear: Hearing, tympanic membrane, external ear and ear canal normal.  Left Ear: Hearing, tympanic  membrane, external ear and ear canal normal.  Nose: Nose normal.  Eyes: Conjunctivae, EOM and lids are normal. Pupils are equal, round, and reactive to light. No foreign bodies found.  Neck: Trachea normal and normal range of motion. Neck supple. Carotid bruit is not present. No mass and no thyromegaly present.  Cardiovascular: Normal rate, regular rhythm, S1 normal, S2 normal, normal heart sounds and intact distal pulses.  Exam reveals no gallop.   No murmur heard. Pulmonary/Chest: Effort normal and breath sounds normal. No respiratory distress. She has no wheezes. She has no rhonchi. She has no rales.  Abdominal: Soft. Normal appearance and bowel sounds are normal. She exhibits no distension, no fluid wave, no abdominal bruit and no mass. There is no hepatosplenomegaly. There is no tenderness. There is no rebound, no guarding and no CVA tenderness. No hernia.  Genitourinary: Vagina normal and uterus normal. No breast swelling, tenderness, discharge or bleeding. Pelvic exam was performed with patient prone. There is no rash, tenderness or lesion on the right labia. There is no rash, tenderness or lesion on the left labia. Uterus is not enlarged and not tender. Cervix exhibits no motion tenderness, no discharge and no friability. Right adnexum displays no mass, no tenderness and no fullness. Left adnexum displays no mass, no tenderness and no fullness.  Lymphadenopathy:    She has no cervical adenopathy.    She has no axillary adenopathy.  Neurological: She is alert. She has normal strength. No cranial nerve deficit or sensory deficit.  Skin: Skin is warm, dry and intact. No rash noted.  Psychiatric: Her speech is normal and behavior is normal. Judgment normal. Her mood appears not anxious. Cognition and memory are normal. She does not exhibit a  depressed mood.          Assessment & Plan:  CPX: The patient's preventative maintenance and recommended screening tests for an annual wellness exam  were reviewed in full today. Brought up to date unless services declined.  Counselled on the importance of diet, exercise, and its role in overall health and mortality. The patient's FH and SH was reviewed, including their home life, tobacco status, and drug and alcohol status.   Uptodate with Td Flu received at work. Colon cancer screening: colonscopy.. Order sent Mammogram ordered PAP/DVE: last pap 2010.Marland Kitchen On q 2 year cycle with pap today, then will move to q3 year cycle.

## 2010-11-12 NOTE — Assessment & Plan Note (Signed)
Moderate control, may have improved with black cohosh for menopausal symptoms.

## 2010-11-12 NOTE — Progress Notes (Signed)
Addended byKerby Nora E on: 11/12/2010 12:18 PM   Modules accepted: Orders

## 2010-11-17 ENCOUNTER — Encounter: Payer: Self-pay | Admitting: Gastroenterology

## 2010-11-18 ENCOUNTER — Encounter: Payer: Self-pay | Admitting: *Deleted

## 2010-11-29 ENCOUNTER — Ambulatory Visit
Admission: RE | Admit: 2010-11-29 | Discharge: 2010-11-29 | Disposition: A | Discharge status: Home / Self Care | Payer: BC Managed Care – PPO | Source: Ambulatory Visit | Attending: Family Medicine | Admitting: Family Medicine

## 2010-11-29 DIAGNOSIS — Z1231 Encounter for screening mammogram for malignant neoplasm of breast: Secondary | ICD-10-CM

## 2010-12-06 LAB — CBC
HCT: 38.9
Hemoglobin: 13.3
MCHC: 34.1
RDW: 13.6

## 2010-12-06 LAB — SEDIMENTATION RATE: Sed Rate: 7

## 2010-12-06 LAB — DIFFERENTIAL
Basophils Absolute: 0.1
Basophils Relative: 1
Eosinophils Relative: 2
Monocytes Absolute: 0.6

## 2010-12-15 ENCOUNTER — Other Ambulatory Visit: Payer: Self-pay | Admitting: Family Medicine

## 2010-12-21 ENCOUNTER — Encounter: Payer: Self-pay | Admitting: Family Medicine

## 2010-12-21 ENCOUNTER — Ambulatory Visit (INDEPENDENT_AMBULATORY_CARE_PROVIDER_SITE_OTHER): Payer: BC Managed Care – PPO | Admitting: Family Medicine

## 2010-12-21 VITALS — BP 120/78 | HR 86 | Temp 98.2°F | Ht 64.0 in | Wt 169.4 lb

## 2010-12-21 DIAGNOSIS — J029 Acute pharyngitis, unspecified: Secondary | ICD-10-CM

## 2010-12-21 DIAGNOSIS — J020 Streptococcal pharyngitis: Secondary | ICD-10-CM

## 2010-12-21 LAB — POCT RAPID STREP A (OFFICE): Rapid Strep A Screen: POSITIVE — AB

## 2010-12-21 MED ORDER — PENICILLIN G BENZATHINE & PROC 1200000 UNIT/2ML IM SUSP
1.2000 10*6.[IU] | Freq: Once | INTRAMUSCULAR | Status: AC
  Administered 2010-12-21: 1.2 10*6.[IU] via INTRAMUSCULAR

## 2010-12-21 NOTE — Progress Notes (Signed)
  Subjective:    Patient ID: Cassandra Wise, female    DOB: 06-10-1960, 50 y.o.   MRN: [id number]  HPI  Pt presents with ST.  Patient presents with sore throat for 2 days. Subjective fevers, achiness, headache. Some nausea. No significant URI sx. No significant cough.  The PMH, PSH, Social History, Family History, Medications, and allergies have been reviewed in [facility], and have been updated if relevant.   ROS: GEN: Acute illness details above GI: Tolerating PO intake GU: maintaining adequate hydration and urination Pulm: No SOB Interactive and getting along well at home.  Otherwise, ROS is as per the HPI.   Physical Exam  Blood pressure 120/76, pulse 82, temperature 98.5 F (36.9 C), temperature source Oral, height 5\' 9"  (1.753 m), weight 156 lb 12.8 oz (71.124 kg), SpO2 98.00%.  Gen: WDWN, NAD; A & O x3, cooperative. Pleasant.Globally Non-toxic HEENT: Normocephalic and atraumatic. Throat: swollen tonsills with exudate R TM clear, L TM - good landmarks, No fluid present. rhinnorhea. No frontal or maxillary sinus T. MMM NECK: Anterior cervical  LAD is present - TTP CV: RRR, No M/G/R, cap refill <2 sec PULM: Breathing comfortably in no respiratory distress. no wheezing, crackles, rhonchi EXT: No c/c/e PSYCH: Friendly, good eye contact MSK: Nml gait   A/P: Strep Throat Strep test + Treat with ABX Supportive care Bicillin LA- 1.2 M units    Review of Systems     Objective:   Physical Exam        Assessment & Plan:

## 2010-12-22 ENCOUNTER — Encounter: Payer: Self-pay | Admitting: *Deleted

## 2010-12-27 ENCOUNTER — Telehealth: Payer: Self-pay | Admitting: *Deleted

## 2010-12-27 NOTE — Telephone Encounter (Signed)
Patient advised.

## 2010-12-27 NOTE — Telephone Encounter (Signed)
Cassandra Wise can you call and check on her -- as long as only a minimal amount, likely OK. PCN is still active in her system. This is effective in> 97% of strep throat. Can you find out more information.

## 2010-12-27 NOTE — Telephone Encounter (Signed)
Spoke with patient and she's not throwing up only blood, it's mixed in with her phlegm and per pt it's only "every now and then". Please advise

## 2010-12-27 NOTE — Telephone Encounter (Signed)
Noted. No other additional action needed. As long as improving by end of week, would not alter plan of care. Could do salt water gargles

## 2010-12-27 NOTE — Telephone Encounter (Signed)
Pt was seen last week for strep and given PCN injection.  She is better but her throat is still raw and she is spitting up some blood.  She is asking if she needs to be seen again or do you want to call her in something.  Uses [facility].

## 2010-12-29 ENCOUNTER — Ambulatory Visit (AMBULATORY_SURGERY_CENTER): Payer: BC Managed Care – PPO | Admitting: *Deleted

## 2010-12-29 VITALS — Ht 64.0 in | Wt 169.6 lb

## 2010-12-29 DIAGNOSIS — Z1211 Encounter for screening for malignant neoplasm of colon: Secondary | ICD-10-CM

## 2010-12-29 MED ORDER — MOVIPREP 100 G PO SOLR: ORAL | Status: DC

## 2010-12-30 ENCOUNTER — Encounter: Payer: Self-pay | Admitting: Gastroenterology

## 2011-01-12 ENCOUNTER — Encounter: Payer: Self-pay | Admitting: Gastroenterology

## 2011-01-12 ENCOUNTER — Ambulatory Visit (AMBULATORY_SURGERY_CENTER): Payer: BC Managed Care – PPO | Admitting: Gastroenterology

## 2011-01-12 VITALS — BP 118/61 | HR 69 | Temp 97.7°F | Resp 19 | Ht 64.0 in | Wt 169.0 lb

## 2011-01-12 DIAGNOSIS — R198 Other specified symptoms and signs involving the digestive system and abdomen: Secondary | ICD-10-CM

## 2011-01-12 DIAGNOSIS — Z1211 Encounter for screening for malignant neoplasm of colon: Secondary | ICD-10-CM

## 2011-01-12 DIAGNOSIS — R933 Abnormal findings on diagnostic imaging of other parts of digestive tract: Secondary | ICD-10-CM

## 2011-01-12 DIAGNOSIS — D126 Benign neoplasm of colon, unspecified: Secondary | ICD-10-CM

## 2011-01-12 DIAGNOSIS — R6889 Other general symptoms and signs: Secondary | ICD-10-CM

## 2011-01-12 MED ORDER — SODIUM CHLORIDE 0.9 % IV SOLN: 500.0000 mL | INTRAVENOUS | Status: DC

## 2011-01-12 NOTE — Patient Instructions (Signed)
Your biopsy results will be mailed to you within two weeks.   Please, try to reduce your use of NSAIDS due to the inflammation in your colon.   You may resume all other routine medications today.    If you have any questions, please call us at [phone].  Thank-you.

## 2011-01-12 NOTE — Progress Notes (Signed)
Patient did not experience any of the following events: a burn prior to discharge; a fall within the facility; wrong site/side/patient/procedure/implant event; or a hospital transfer or hospital admission upon discharge from the facility. (G8907) Patient did not have preoperative order for IV antibiotic SSI prophylaxis. (G8918)  

## 2011-01-17 ENCOUNTER — Telehealth: Payer: Self-pay | Admitting: *Deleted

## 2011-01-17 NOTE — Telephone Encounter (Signed)

## 2011-01-24 ENCOUNTER — Encounter: Payer: Self-pay | Admitting: Family Medicine

## 2011-01-24 ENCOUNTER — Ambulatory Visit (INDEPENDENT_AMBULATORY_CARE_PROVIDER_SITE_OTHER): Payer: BC Managed Care – PPO | Admitting: Family Medicine

## 2011-01-24 DIAGNOSIS — J329 Chronic sinusitis, unspecified: Secondary | ICD-10-CM

## 2011-01-24 DIAGNOSIS — R05 Cough: Secondary | ICD-10-CM

## 2011-01-24 MED ORDER — HYDROCOD POLST-CHLORPHEN POLST 10-8 MG/5ML PO LQCR: 5.0000 mL | Freq: Every evening | ORAL | Status: DC | PRN

## 2011-01-24 MED ORDER — PREDNISONE 20 MG PO TABS: 40.0000 mg | ORAL_TABLET | Freq: Every day | ORAL | Status: AC

## 2011-01-24 MED ORDER — AMOXICILLIN-POT CLAVULANATE 875-125 MG PO TABS: 1.0000 | ORAL_TABLET | Freq: Two times a day (BID) | ORAL | Status: AC

## 2011-01-24 NOTE — Assessment & Plan Note (Signed)
2wk h/o cough, acutely worse over weekend with sinus tenderness on exam. Anticipate acute sinusitis component, treat with augmentin x 10 days. Update if sxs not improving.

## 2011-01-24 NOTE — Assessment & Plan Note (Signed)
2wk h/o cough, anticipate some sinusitis component but not likely accounting for all of sxs. Some bronchospasm component. Will treat with prednisone course.  Pt states has taken in past.

## 2011-01-24 NOTE — Progress Notes (Signed)
  Subjective:    Patient ID: Cassandra Wise, female    DOB: 04-08-1960, 50 y.o.   MRN: [id number]  HPI CC: cough  2 wk h/o cough, worsening over this weekend.  Dry cough.  Now ribs hurt from coughing, throat hurts.  This am awoke with sinus congestion and pressure.  Feels tickle in back of throat sometimes makes her gag with coughing.  + sinus pressure HA this morning.  Felt feverish and chills at night.  Ears completely clogged up.  Previously treated for GERD, so took some prilosec but that didn't help.  Tried mucinex, tussionex which isn't helping.  No PNDrainage, tooth pain, abd pain, n/v, arthralgia.  Seen 10/13/2010 with dx bronchitis, treated with bactrim. Seen 12/21/2010 with dx strep pharyngitis and treated with shot bicillin 1.28m units.  Took 2 wks to get over this.  Review of Systems Per HPI    Objective:   Physical Exam  Nursing note and vitals reviewed. Constitutional: She appears well-developed and well-nourished. No distress.       Deep dry cough  HENT:  Head: Normocephalic and atraumatic.  Right Ear: Hearing, tympanic membrane, external ear and ear canal normal.  Left Ear: Hearing, tympanic membrane, external ear and ear canal normal.  Nose: No mucosal edema or rhinorrhea. Right sinus exhibits frontal sinus tenderness. Right sinus exhibits no maxillary sinus tenderness. Left sinus exhibits frontal sinus tenderness. Left sinus exhibits no maxillary sinus tenderness.  Mouth/Throat: Uvula is midline, oropharynx is clear and moist and mucous membranes are normal. No oropharyngeal exudate, posterior oropharyngeal edema, posterior oropharyngeal erythema or tonsillar abscesses.  Eyes: Conjunctivae and EOM are normal. Pupils are equal, round, and reactive to light. No scleral icterus.  Neck: Normal range of motion. Neck supple. No JVD present. No thyromegaly present.  Cardiovascular: Normal rate, regular rhythm, normal heart sounds and intact distal pulses.   No murmur  heard. Pulmonary/Chest: Effort normal and breath sounds normal. No respiratory distress. She has no wheezes. She has no rales.  Lymphadenopathy:    She has no cervical adenopathy.  Skin: Skin is warm and dry. No rash noted.       Assessment & Plan:

## 2011-01-24 NOTE — Patient Instructions (Signed)
Sounds like sinusitis as well as reactive cough. Treat with augmentin and prednisone course. Let us know if fever >101 or worsening cough after treatment. Good to see you today.  Call us with questions. Continue to push fluids and rest. May continue mucinex.

## 2011-01-24 NOTE — Progress Notes (Signed)
E-scribe error. Rx called into pharmacy.

## 2011-01-25 ENCOUNTER — Other Ambulatory Visit: Payer: Self-pay | Admitting: Family Medicine

## 2011-02-11 ENCOUNTER — Other Ambulatory Visit (INDEPENDENT_AMBULATORY_CARE_PROVIDER_SITE_OTHER): Payer: BC Managed Care – PPO

## 2011-02-11 DIAGNOSIS — E7800 Pure hypercholesterolemia, unspecified: Secondary | ICD-10-CM

## 2011-02-11 LAB — LIPID PANEL
Cholesterol: 198 mg/dL (ref 0–200)
Triglycerides: 101 mg/dL (ref 0.0–149.0)

## 2011-02-28 ENCOUNTER — Telehealth: Payer: Self-pay | Admitting: *Deleted

## 2011-02-28 ENCOUNTER — Encounter: Payer: Self-pay | Admitting: *Deleted

## 2011-02-28 NOTE — Telephone Encounter (Signed)
Patient advised that letter ready for pick up

## 2011-02-28 NOTE — Telephone Encounter (Signed)
Please write letter as requested and stamp. Red yeast rice 2400 mg daily for cholesterol and glucosamine 500 mg 1-3 times daily for osteoarthritis.

## 2011-02-28 NOTE — Telephone Encounter (Signed)
Patient called requesting a letter of recommendation for the Red Yeast Rice and Glucosamine that she is taking. Patient states that she needs this for her flex spending. Please let her know when it is ready.

## 2011-06-28 ENCOUNTER — Telehealth: Payer: Self-pay | Admitting: Family Medicine

## 2011-06-28 DIAGNOSIS — D239 Other benign neoplasm of skin, unspecified: Secondary | ICD-10-CM

## 2011-06-28 NOTE — Telephone Encounter (Signed)
Pt was referred to Dr. Dorita Sciara office (dermatologist) by Dr. Ermalene Searing and now dermatologist wants to refer her to a hand specialist. Pt does has records of biopsy done on hand for the reason to be referred out to a hand specialist. The dermatology office does not refer pt's out so they told her to contact Dr. Ermalene Searing to get a referral to a hand specialist.

## 2011-06-28 NOTE — Telephone Encounter (Signed)
What is reason/diagnosis for pt to be referred to hand specialist.. I don't see in 213 derm note...ask pt please so I can make referral.

## 2011-06-29 DIAGNOSIS — D239 Other benign neoplasm of skin, unspecified: Secondary | ICD-10-CM | POA: Insufficient documentation

## 2011-06-29 NOTE — Telephone Encounter (Signed)
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

## 2011-06-29 NOTE — Telephone Encounter (Signed)
Referral sent 

## 2011-08-05 ENCOUNTER — Telehealth: Payer: Self-pay | Admitting: *Deleted

## 2011-08-05 ENCOUNTER — Telehealth: Payer: Self-pay | Admitting: Oncology

## 2011-08-05 NOTE — Telephone Encounter (Signed)
called pt and scheduled appt for 06/18.  faxed over a letter to Dr. Terri Piedra with appt d/t

## 2011-08-05 NOTE — Telephone Encounter (Signed)
Phone call to patient to give appointment time for Dr.Sherrill and this RN contact information.  Explained to patient she would receive a call from the schedulers to confirm appointment date and time.  This RN answered questions and asked patient to write down any further questions for the physician to answer at MD visit.  Patient verbalized understanding.

## 2011-08-08 ENCOUNTER — Telehealth: Payer: Self-pay | Admitting: Oncology

## 2011-08-08 NOTE — Telephone Encounter (Signed)
Dx- Adenocarcinoma

## 2011-08-09 ENCOUNTER — Ambulatory Visit (HOSPITAL_BASED_OUTPATIENT_CLINIC_OR_DEPARTMENT_OTHER): Payer: Managed Care, Other (non HMO) | Admitting: Oncology

## 2011-08-09 ENCOUNTER — Ambulatory Visit: Payer: Managed Care, Other (non HMO)

## 2011-08-09 ENCOUNTER — Ambulatory Visit (HOSPITAL_BASED_OUTPATIENT_CLINIC_OR_DEPARTMENT_OTHER): Payer: Managed Care, Other (non HMO) | Admitting: Lab

## 2011-08-09 ENCOUNTER — Telehealth: Payer: Self-pay | Admitting: Oncology

## 2011-08-09 VITALS — BP 124/77 | HR 74 | Temp 97.7°F | Ht 64.0 in | Wt 163.9 lb

## 2011-08-09 DIAGNOSIS — C801 Malignant (primary) neoplasm, unspecified: Secondary | ICD-10-CM

## 2011-08-09 LAB — CBC WITH DIFFERENTIAL/PLATELET
BASO%: 1.9 % (ref 0.0–2.0)
Basophils Absolute: 0.1 10*3/uL (ref 0.0–0.1)
HCT: 38.5 % (ref 34.8–46.6)
HGB: 12.7 g/dL (ref 11.6–15.9)
MONO#: 0.5 10*3/uL (ref 0.1–0.9)
NEUT%: 51.1 % (ref 38.4–76.8)
RDW: 14 % (ref 11.2–14.5)
WBC: 5.5 10*3/uL (ref 3.9–10.3)
lymph#: 1.9 10*3/uL (ref 0.9–3.3)

## 2011-08-09 LAB — COMPREHENSIVE METABOLIC PANEL
BUN: 12 mg/dL (ref 6–23)
CO2: 27 mEq/L (ref 19–32)
Calcium: 9.2 mg/dL (ref 8.4–10.5)
Chloride: 103 mEq/L (ref 96–112)
Creatinine, Ser: 0.73 mg/dL (ref 0.50–1.10)
Glucose, Bld: 94 mg/dL (ref 70–99)

## 2011-08-09 NOTE — Telephone Encounter (Signed)
appt made and printed for pt,pt aware that HIM will send records and ref to [facility]    [facility]

## 2011-08-09 NOTE — Progress Notes (Signed)
[facility] New Patient Consult   Referring MD: Doniqua Saxby Hudson-Floyd 51 y.o.  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     HPI: She reports noting a raised lesion at the tip of the right third finger approximately 2 years ago. She saw Dr. 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 The lesion enlarged when she saw Dr. Terri Piedra for a followup visit. She was referred to Dr.Weingold. On 07/05/2011 a small palpable lesion was noted at the tip of the right third finger on the ulnar side extending from the tip to the level of the DIP joint. An x-ray showed scalloping of the distal phalanx along the OR side and soft tissue shadows.  She was taken to excisional biopsy procedure on 07/29/2011 and the pathology confirmed adenocarcinoma. The biopsies were noted to be fragmented. Neoplastic cells were arranged in solid and cystic patterns with occasional mitotic figures. 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  She reports pain with trauma to the right finger. She otherwise felt well prior to the biopsy procedure.  Past medical history: 1. History of an elevated cholesterol 2. G1 P1, menstrual cycle every 20 days lasting for 2 days 3. Removal of precancerous lesions over the anterior chest in February 2013 4. Tiny lung nodules noted on CT the chest in 2009 (stable between June of 2009 and December of 2009) 5. Negative mammogram on 11/30/2010 6. Colonoscopy 01/12/2011-area of abnormal mucosa noted in the sigmoid colon-benign biopsy Past Surgical History  Procedure Date  . Incontinence surgery   . Breast surgery  2006     augmentation  . Cosmetic surgery  2006     mini tummy tuck  . Cesarean section     Family History  Problem  Relation Age of Onset  . Depression Father   . Hyperlipidemia Father   . Coronary artery disease Father   . Colon cancer Paternal Grandmother    .    Esophagus/gastric cancer-paternal uncle  .    Lung cancer-maternal great aunt  .    Pancreatic cancer-maternal great uncle  .    Maternal grandmother and maternal great-grandmother-basal cell skin cancer  Current outpatient prescriptions:glucosamine-chondroitin 500-400 MG tablet, Take 1 tablet by mouth 3 (three) times daily.  , Disp: , Rfl: ;  minocycline (MINOCIN,DYNACIN) 100 MG capsule, Take 100 mg by mouth 2 (two) times daily., Disp: , Rfl: ;  Red Yeast Rice 600 MG TABS, Take 2 tablets by mouth 2 (two) times daily.  , Disp: , Rfl: ;  BLACK COHOSH HOT FLASH RELIEF PO, Take by mouth.  , Disp: , Rfl:  chlorpheniramine-HYDROcodone (TUSSIONEX) 10-8 MG/5ML LQCR, Take 5 mLs by mouth at bedtime as needed. Sedation precautions, Disp: 140 mL, Rfl: 0;  meclizine (ANTIVERT) 25 MG tablet, Take 25 mg by mouth as directed.  , Disp: , Rfl: ;  sertraline (ZOLOFT) 25 MG tablet, TAKE 1 TABLET BY MOUTH EVERY DAY, Disp: 30 tablet, Rfl: 5;  vitamin B-12 (CYANOCOBALAMIN) 1000 MCG tablet, TAKE ONE TABLET DAILY, Disp: 30 tablet, Rfl: 1  Allergies:  Allergies  Allergen Reactions  . Clarithromycin     REACTION: Rash    Social History: She lives in which it. She works in an office occupation. She does not use tobacco. She  drinks wine occasionally. No transfusion history. No risk factor for HIV or hepatitis. No unusual exposures.    ROS:   Positives include: Intermittent constipation, urinary incontinence-chronic, difficulty completely flex in the right third finger, headaches prior to and following her menstrual cycle, chronic bilateral shoulder pain  A complete ROS was otherwise negative.  Physical Exam:  Blood pressure 124/77, pulse 74, temperature 97.7 F (36.5 C), temperature source Oral, height 5\' 4"  (1.626 m), weight 163 lb 14.4 oz (74.345 kg).  HEENT:  Oropharynx without visible mass, neck without mass Lungs: Clear bilaterally Cardiac: Regular rate and rhythm Abdomen: No hepatomegaly, no mass, nontender Breast: No mass in either breast  Vascular: No leg edema Lymph nodes: No cervical, supraclavicular, axillary, or inguinal nodes Neurologic: Alert and oriented, the motor exam appears grossly intact in the upper and lower extremities Skin: Hyperpigmentation in a suntan distribution Musculoskeletal: No spine tenderness, sutures in place at the lumbar aspect of the right third finger. No palpable mass.   LAB:  CBC  Lab Results  Component Value Date   WBC 5.5 08/09/2011   HGB 12.7 08/09/2011   HCT 38.5 08/09/2011   MCV 91.5 08/09/2011   PLT 309 08/09/2011      Assessment/Plan:   1. 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 History of tiny lung nodules on a CT of the chest in 2009   Disposition:   She has been diagnosed with adenocarcinoma involving a biopsy of a soft tissue mass at the distal right third finger. There is no clinical evidence of another primary tumor site or other sites of metastatic disease.. She underwent a negative screening colonoscopy in November 2012 and a negative mammogram in October of 2012.  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 We will refer her to Dr. Lenis Noon to consider the need for imaging of the right third finger and additional surgery. We will schedule a CT of the chest to look for evidence of metastatic disease.  Ms. Artus will return for an office visit in 6 weeks.  Vladislav Axelson 08/09/2011, 5:30 PM

## 2011-08-11 ENCOUNTER — Telehealth: Payer: Self-pay | Admitting: Oncology

## 2011-08-11 NOTE — Telephone Encounter (Addendum)
Dr. Lenis Noon @ [facility] 7/3 @8 . Pt is aware.

## 2011-08-12 ENCOUNTER — Ambulatory Visit
Admission: RE | Admit: 2011-08-12 | Discharge: 2011-08-12 | Disposition: A | Discharge status: Home / Self Care | Payer: Managed Care, Other (non HMO) | Source: Ambulatory Visit | Attending: Oncology | Admitting: Oncology

## 2011-08-12 DIAGNOSIS — C801 Malignant (primary) neoplasm, unspecified: Secondary | ICD-10-CM

## 2011-08-12 MED ORDER — IOHEXOL 300 MG/ML  SOLN
75.0000 mL | Freq: Once | INTRAMUSCULAR | Status: AC | PRN
  Administered 2011-08-12: 75 mL via INTRAVENOUS

## 2011-08-17 ENCOUNTER — Encounter: Payer: Self-pay | Admitting: Oncology

## 2011-08-17 NOTE — Progress Notes (Unsigned)
Put disability form in registration desk. °

## 2011-08-23 ENCOUNTER — Telehealth: Payer: Self-pay

## 2011-08-23 NOTE — Telephone Encounter (Signed)
Joy with [facility] entrance referral dept. Pt is covered with [facility]; does not need authorization # but needs office note or letter from PCP that PCP is aware Dr Myrle Sheng made appt for pt to see Dr Earney Navy in surgical dept at [facility] 08/24/11 for adenocarcinoma of rt third finger. Please advise.

## 2011-08-23 NOTE — Telephone Encounter (Signed)
Please compose letter and I will edit and sign

## 2011-08-24 ENCOUNTER — Encounter: Payer: Self-pay | Admitting: *Deleted

## 2011-08-24 ENCOUNTER — Telehealth: Payer: Self-pay | Admitting: *Deleted

## 2011-08-24 NOTE — Telephone Encounter (Signed)
Completed.

## 2011-08-24 NOTE — Telephone Encounter (Signed)
Spoke with pt, she was aware of CT results. Saw Dr. Lenis Noon today. Pt is scheduled for PET on 09/13/11 and surgery on 7/30. F/U with Dr. Lenis Noon on 09/30/11.  Needs to reschedule office visit with Dr. Truett Perna to mid August. Request sent to schedulers to r/s 7/29 appt.

## 2011-08-24 NOTE — Telephone Encounter (Signed)
Letter routed to you.

## 2011-08-26 ENCOUNTER — Telehealth: Payer: Self-pay | Admitting: Oncology

## 2011-08-26 NOTE — Telephone Encounter (Signed)
Per 7/3 pof moved 7/29 appt to mid August. lmonvm for pt re new appt for 8/16 and mailed new schedule.

## 2011-09-19 ENCOUNTER — Ambulatory Visit: Payer: Managed Care, Other (non HMO) | Admitting: Oncology

## 2011-09-30 DIAGNOSIS — C801 Malignant (primary) neoplasm, unspecified: Secondary | ICD-10-CM | POA: Insufficient documentation

## 2011-10-07 ENCOUNTER — Ambulatory Visit (HOSPITAL_BASED_OUTPATIENT_CLINIC_OR_DEPARTMENT_OTHER): Payer: Managed Care, Other (non HMO) | Admitting: Oncology

## 2011-10-07 ENCOUNTER — Telehealth: Payer: Self-pay | Admitting: Oncology

## 2011-10-07 VITALS — BP 120/77 | HR 87 | Temp 97.5°F | Resp 20 | Ht 64.0 in | Wt 168.0 lb

## 2011-10-07 DIAGNOSIS — R599 Enlarged lymph nodes, unspecified: Secondary | ICD-10-CM

## 2011-10-07 DIAGNOSIS — C801 Malignant (primary) neoplasm, unspecified: Secondary | ICD-10-CM

## 2011-10-07 DIAGNOSIS — C7640 Malignant neoplasm of unspecified upper limb: Secondary | ICD-10-CM

## 2011-10-07 NOTE — Telephone Encounter (Signed)
gve the pt her dec 2013 appt calendar. Pt will do her cxr at [facility] imaging. Orders are in the epic system. gsi will be able to access them

## 2011-10-07 NOTE — Progress Notes (Signed)
   [facility]    OFFICE PROGRESS NOTE   INTERVAL HISTORY:   She returns as scheduled. She underwent a distal amputation of the right third finger by Dr. Lenis Noon approximately 2 weeks ago. Lymph nodes were removed from the right axilla and right epitrochlear regions. She reports a surgical margins were negative and the lymph nodes contained no metastatic disease (we do not have the operative report or pathology report available today).  She has developed swelling at the upper right axilla and right pectoral regions. The stitches were removed from the right finger this week. She also reports Dr. Lenis Noon drained "fluid" from the right axilla. There is numbness at the right upper medial arm.  She feels well. No other complaint.  Objective:  Vital signs in last 24 hours:  Blood pressure 120/77, pulse 87, temperature 97.5 F (36.4 C), temperature source Oral, resp. rate 20, height 5\' 4"  (1.626 m), weight 168 lb (76.204 kg).    HEENT: Neck without mass Lymphatics: No cervical, supraclavicular, or axillary nodes. There is a seroma/hematoma at the right axillary surgical site a small fluid collection in the right axilla. Resp: Lungs clear bilaterally Cardio: Regular rate and rhythm GI: No hepatomegaly Vascular: No leg edema Muscular skeletal: Status post distal amputation of the right third finger with a healing incision.     Medications: I have reviewed the patient's current medications.  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 apparent post operative seroma/axillary fluid collection   3. History of tiny lung nodules on a CT of the chest in 2009 , stable on a CT 08/12/2011.  Disposition:  She is recovering from the finger amputation. We will followup on the pathology report and operative note.  Ms. Ingman would  like to continue clinical followup in [place]. I explained there is no "standard" surveillance recommendation with this rare tumor. She will return for an office visit and chest x-ray in 4 months.  She plans to followup with Dr. Mina Marble and Dr. Lenis Noon for postoperative care .   Thornton Papas, MD  10/07/2011  12:52 PM

## 2011-11-10 ENCOUNTER — Other Ambulatory Visit (INDEPENDENT_AMBULATORY_CARE_PROVIDER_SITE_OTHER): Payer: Managed Care, Other (non HMO)

## 2011-11-10 DIAGNOSIS — E785 Hyperlipidemia, unspecified: Secondary | ICD-10-CM

## 2011-11-10 DIAGNOSIS — R5381 Other malaise: Secondary | ICD-10-CM

## 2011-11-10 DIAGNOSIS — E7800 Pure hypercholesterolemia, unspecified: Secondary | ICD-10-CM

## 2011-11-10 LAB — BASIC METABOLIC PANEL
BUN: 19 mg/dL (ref 6–23)
Chloride: 105 mEq/L (ref 96–112)
GFR: 76.94 mL/min (ref >60.00)
Potassium: 4.8 mEq/L (ref 3.5–5.1)
Sodium: 137 mEq/L (ref 135–145)

## 2011-11-10 LAB — CBC WITH DIFFERENTIAL/PLATELET
Basophils Relative: 1.2 % (ref 0.0–3.0)
Eosinophils Absolute: 0.2 10*3/uL (ref 0.0–0.7)
Lymphocytes Relative: 30.8 % (ref 12.0–46.0)
MCHC: 32.6 g/dL (ref 30.0–36.0)
Monocytes Absolute: 0.6 10*3/uL (ref 0.1–1.0)
Neutrophils Relative %: 54.4 % (ref 43.0–77.0)
Platelets: 355 10*3/uL (ref 150.0–400.0)
RBC: 4.02 Mil/uL (ref 3.87–5.11)
WBC: 5.3 10*3/uL (ref 4.5–10.5)

## 2011-11-10 LAB — LIPID PANEL
HDL: 62.8 mg/dL (ref >39.00)
Triglycerides: 40 mg/dL (ref 0.0–149.0)
VLDL: 8 mg/dL (ref 0.0–40.0)

## 2011-11-10 LAB — HEPATIC FUNCTION PANEL
ALT: 15 U/L (ref 0–35)
AST: 16 U/L (ref 0–37)
Bilirubin, Direct: 0.1 mg/dL (ref 0.0–0.3)
Total Bilirubin: 0.5 mg/dL (ref 0.3–1.2)

## 2011-11-10 LAB — LDL CHOLESTEROL, DIRECT: Direct LDL: 119.9 mg/dL

## 2011-11-22 ENCOUNTER — Other Ambulatory Visit (HOSPITAL_COMMUNITY)
Admission: RE | Admit: 2011-11-22 | Discharge: 2011-11-22 | Disposition: A | Discharge status: Home / Self Care | Payer: Managed Care, Other (non HMO) | Source: Ambulatory Visit | Attending: Family Medicine | Admitting: Family Medicine

## 2011-11-22 ENCOUNTER — Encounter: Payer: Self-pay | Admitting: Family Medicine

## 2011-11-22 ENCOUNTER — Ambulatory Visit (INDEPENDENT_AMBULATORY_CARE_PROVIDER_SITE_OTHER): Payer: Managed Care, Other (non HMO) | Admitting: Family Medicine

## 2011-11-22 VITALS — BP 110/78 | HR 68 | Temp 98.4°F | Ht 65.0 in | Wt 166.0 lb

## 2011-11-22 DIAGNOSIS — C801 Malignant (primary) neoplasm, unspecified: Secondary | ICD-10-CM

## 2011-11-22 DIAGNOSIS — Z0000 Encounter for general adult medical examination without abnormal findings: Secondary | ICD-10-CM

## 2011-11-22 DIAGNOSIS — Z1151 Encounter for screening for human papillomavirus (HPV): Secondary | ICD-10-CM | POA: Insufficient documentation

## 2011-11-22 DIAGNOSIS — Z23 Encounter for immunization: Secondary | ICD-10-CM

## 2011-11-22 DIAGNOSIS — Z01419 Encounter for gynecological examination (general) (routine) without abnormal findings: Secondary | ICD-10-CM | POA: Insufficient documentation

## 2011-11-22 DIAGNOSIS — J984 Other disorders of lung: Secondary | ICD-10-CM

## 2011-11-22 NOTE — Patient Instructions (Addendum)
Stop at front desk to schedule digital screening mammogram... Pt requests digital per her oncologist given her diagnosis in last year of primary digital papillary adenocarcinoma, to make sure no other primary other than finger. Continue healthy eating and regular exercise.

## 2011-11-22 NOTE — Progress Notes (Signed)
The patient is here for annual wellness exam and preventative care.   Depression, anxiety: Stable control off sertraline in last 8 months.  Insomnia: Resolved. .  Elevated Cholesterol: Improved control on red yeast rice. Goal LDL <130.  Lab Results  Component Value Date   CHOL 202* 11/10/2011   HDL 62.80 11/10/2011   LDLCALC 121* 02/11/2011   LDLDIRECT 119.9 11/10/2011   TRIG 40.0 11/10/2011   CHOLHDL 3 11/10/2011   Using medications without problems:  NOne Muscle aches: None Other complaints:  Walking twice a week, does zoomba.  Eating really healthy low chol foods.   Has ben diagnosed with cancer (primary digital papillary adenocarcinoma) at finger. Dx 07/2011 Has had partial amputation of finger 3rd digit Seeing Dr. Myrle Sheng ONC, Dr. Daphine Deutscher, Derm, Dr. Mina Marble, Hand specialist. Dr. Lenis Noon surgical oncologist: Had PET scan nml, lymph node mapping, performed amputation.  Dr. Truett Perna plans repeating CXR in 4 month to assure stability of lung nodules.  Review of Systems  Constitutional: Negative for fever, fatigue and unexpected weight change.  HENT: Negative for ear pain, congestion, sore throat, sneezing, trouble swallowing and sinus pressure.  Eyes: Negative for pain and itching.  Respiratory: Negative for cough, chest tightness, shortness of breath and wheezing.  Cardiovascular: Negative for chest pain, palpitations and leg swelling.  Gastrointestinal: Negative for nausea, abdominal pain, diarrhea, constipation and blood in stool.  Genitourinary: Negative for dysuria, hematuria, vaginal discharge, difficulty urinating and menstrual problem.  Skin: Negative for rash.  Neurological: Negative for syncope, weakness, light-headedness, numbness and headaches.  Psychiatric/Behavioral: Negative for confusion and dysphoric mood. The patient is not nervous/anxious.    Objective:   Physical Exam  Constitutional: Vital signs are normal. She appears well-developed and well-nourished. She  is cooperative. Non-toxic appearance. She does not appear ill. No distress.  HENT:  Head: Normocephalic.  Right Ear: Hearing, tympanic membrane, external ear and ear canal normal.  Left Ear: Hearing, tympanic membrane, external ear and ear canal normal.  Nose: Nose normal.  Eyes: Conjunctivae, EOM and lids are normal. Pupils are equal, round, and reactive to light. No foreign bodies found.  Neck: Trachea normal and normal range of motion. Neck supple. Carotid bruit is not present. No mass and no thyromegaly present.  Cardiovascular: Normal rate, regular rhythm, S1 normal, S2 normal, normal heart sounds and intact distal pulses. Exam reveals no gallop.  No murmur heard.  Pulmonary/Chest: Effort normal and breath sounds normal. No respiratory distress. She has no wheezes. She has no rhonchi. She has no rales.  Abdominal: Soft. Normal appearance and bowel sounds are normal. She exhibits no distension, no fluid wave, no abdominal bruit and no mass. There is no hepatosplenomegaly. There is no tenderness. There is no rebound, no guarding and no CVA tenderness. No hernia.  Genitourinary: Vagina normal and uterus normal. No breast swelling, tenderness, discharge or bleeding. Pelvic exam was performed with patient prone. There is no rash, tenderness or lesion on the right labia. There is no rash, tenderness or lesion on the left labia. Uterus is not enlarged and not tender. Cervix exhibits no motion tenderness, no discharge and no friability. Right adnexum displays no mass, no tenderness and no fullness. Left adnexum displays no mass, no tenderness and no fullness.  Lymphadenopathy:  She has no cervical adenopathy.  She has no axillary adenopathy.  Neurological: She is alert. She has normal strength. No cranial nerve deficit or sensory deficit.  Skin: Skin is warm, dry and intact. No rash noted.  Right 3rd digit:  partial amputation. Psychiatric: Her speech is normal and behavior is normal. Judgment normal.  Her mood appears not anxious. Cognition and memory are normal. She does not exhibit a depressed mood.    Assessment & Plan:   CPX: The patient's preventative maintenance and recommended screening tests for an annual wellness exam were reviewed in full today.  Brought up to date unless services declined.  Counselled on the importance of diet, exercise, and its role in overall health and mortality.  The patient's FH and SH was reviewed, including their home life, tobacco status, and drug and alcohol status.   Uptodate with Td  Flu  Given today. Colon cancer screening: colonscopy.[facility] 2012, Dr. Larae Grooms, repeat in 10 years Mammogram ordered, would like to digital ammo this year.  PAP/DVE: last pap 2012..  q3 year cycle, but given pt with cancer in last year she would like to do pap this year anyway.  DVE, pap today

## 2011-11-29 ENCOUNTER — Encounter: Payer: Self-pay | Admitting: *Deleted

## 2011-12-16 ENCOUNTER — Ambulatory Visit
Admission: RE | Admit: 2011-12-16 | Discharge: 2011-12-16 | Disposition: A | Discharge status: Home / Self Care | Payer: Managed Care, Other (non HMO) | Source: Ambulatory Visit | Attending: Family Medicine | Admitting: Family Medicine

## 2011-12-16 DIAGNOSIS — C801 Malignant (primary) neoplasm, unspecified: Secondary | ICD-10-CM

## 2012-02-06 ENCOUNTER — Ambulatory Visit (HOSPITAL_BASED_OUTPATIENT_CLINIC_OR_DEPARTMENT_OTHER): Payer: Managed Care, Other (non HMO) | Admitting: Oncology

## 2012-02-06 ENCOUNTER — Telehealth: Payer: Self-pay | Admitting: Oncology

## 2012-02-06 ENCOUNTER — Ambulatory Visit
Admission: RE | Admit: 2012-02-06 | Discharge: 2012-02-06 | Disposition: A | Discharge status: Home / Self Care | Payer: Managed Care, Other (non HMO) | Source: Ambulatory Visit | Attending: Oncology | Admitting: Oncology

## 2012-02-06 VITALS — BP 129/71 | HR 66 | Temp 97.0°F | Resp 20 | Ht 65.0 in | Wt 171.7 lb

## 2012-02-06 DIAGNOSIS — M25519 Pain in unspecified shoulder: Secondary | ICD-10-CM

## 2012-02-06 DIAGNOSIS — C801 Malignant (primary) neoplasm, unspecified: Secondary | ICD-10-CM

## 2012-02-06 DIAGNOSIS — C7640 Malignant neoplasm of unspecified upper limb: Secondary | ICD-10-CM

## 2012-02-06 NOTE — Progress Notes (Signed)
   [facility]    OFFICE PROGRESS NOTE   INTERVAL HISTORY:   She returns as scheduled. She feels well. She has mild discomfort at the right shoulder. This has been a problem in the past. She recently returned from an exercise/weight lifting program. There is a nodular area at the palm of the right hand. This has been present since surgery. Good appetite and energy level.  Objective:  Vital signs in last 24 hours:  There were no vitals taken for this visit.    HEENT: Neck without mass Lymphatics: No cervical, supraclavicular, or axillary nodes Resp: Lungs clear bilaterally Cardio: Regular rate and rhythm GI: No hepatosplenomegaly Vascular: No leg edema , no right arm or hand edema Musculoskeletal: Healed amputation site at the right third finger without evidence of local tumor recurrence. There is nodularity over the flexor tendon at the right palm, discomfort with abduction at the right shoulder with tenderness at the right a.c. joint  X-ray: 02/06/2012-the lungs are clear, no pleural effusion  Medications: I have reviewed the patient's current medications.  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 ? Flexure tendon cyst at the right hand 3. History of tiny lung nodules on a CT of the chest in 2009 , stable on a CT 08/12/2011.  4. Right shoulder discomfort-likely related to benign musculoskeletal condition-she will followup with Dr. Mina Marble if the discomfort persists  Disposition:  She remains in clinical remission from the adenocarcinoma. She will return for an office visit and chest x-ray in 4 months.  Ms. Considine will see Dr. Mina Marble for evaluation of the flexor tendon and right shoulder.   Thornton Papas, MD  02/06/2012  10:58 AM

## 2012-02-06 NOTE — Telephone Encounter (Signed)
appts made and printed for pt  Pt aware to get a cxr prior to appt

## 2012-03-21 ENCOUNTER — Telehealth: Payer: Self-pay

## 2012-03-21 DIAGNOSIS — M25511 Pain in right shoulder: Secondary | ICD-10-CM

## 2012-03-21 DIAGNOSIS — M19049 Primary osteoarthritis, unspecified hand: Secondary | ICD-10-CM

## 2012-03-21 DIAGNOSIS — C801 Malignant (primary) neoplasm, unspecified: Secondary | ICD-10-CM

## 2012-03-21 NOTE — Telephone Encounter (Signed)
Pt left v/m has seen Dr. Mina Marble and needs updated referral to Dr Mina Marble for surgery had on her hand and pts oncologist Dr Truett Perna wants referral for  Right shoulder problem to Dr Mina Marble.Pt was told PCP would have to make referrals. Please advise.

## 2012-04-24 ENCOUNTER — Encounter: Payer: Self-pay | Admitting: Family Medicine

## 2012-04-24 ENCOUNTER — Ambulatory Visit (INDEPENDENT_AMBULATORY_CARE_PROVIDER_SITE_OTHER): Payer: Managed Care, Other (non HMO) | Admitting: Family Medicine

## 2012-04-24 VITALS — BP 120/76 | HR 76 | Temp 98.0°F | Ht 65.0 in | Wt 171.0 lb

## 2012-04-24 DIAGNOSIS — J019 Acute sinusitis, unspecified: Secondary | ICD-10-CM | POA: Insufficient documentation

## 2012-04-24 MED ORDER — AMOXICILLIN 500 MG PO CAPS: 1000.0000 mg | ORAL_CAPSULE | Freq: Two times a day (BID) | ORAL | Status: DC

## 2012-04-24 NOTE — Progress Notes (Signed)
  Subjective:    Patient ID: Cassandra Wise, female    DOB: 1960/07/16, 52 y.o.   MRN: [id number]  Sinusitis This is a new problem. The current episode started in the past 7 days. The problem has been gradually worsening since onset. There has been no fever. Associated symptoms include congestion, ear pain, headaches, neck pain and sinus pressure. Pertinent negatives include no coughing, hoarse voice, shortness of breath, sore throat or swollen glands. (Ear pain  teeth hurt  clear mucus ) Treatments tried: mucinex max strenght. The treatment provided no relief.   She is concerned because she is gping on a trip in few days.   Review of Systems  HENT: Positive for ear pain, congestion, neck pain and sinus pressure. Negative for sore throat and hoarse voice.   Respiratory: Negative for cough and shortness of breath.   Neurological: Positive for headaches.       Objective:   Physical Exam  Constitutional: Vital signs are normal. She appears well-developed and well-nourished. She is cooperative.  Non-toxic appearance. She does not appear ill. No distress.  HENT:  Head: Normocephalic.  Right Ear: Hearing, tympanic membrane, external ear and ear canal normal. Tympanic membrane is not erythematous, not retracted and not bulging.  Left Ear: Hearing, tympanic membrane, external ear and ear canal normal. Tympanic membrane is not erythematous, not retracted and not bulging.  Nose: Mucosal edema and rhinorrhea present. Right sinus exhibits maxillary sinus tenderness. Right sinus exhibits no frontal sinus tenderness. Left sinus exhibits maxillary sinus tenderness. Left sinus exhibits no frontal sinus tenderness.  Mouth/Throat: Uvula is midline, oropharynx is clear and moist and mucous membranes are normal.  Eyes: Conjunctivae, EOM and lids are normal. Pupils are equal, round, and reactive to light. No foreign bodies found.  Neck: Trachea normal and normal range of motion. Neck supple. Carotid bruit  is not present. No mass and no thyromegaly present.  Cardiovascular: Normal rate, regular rhythm, S1 normal, S2 normal, normal heart sounds, intact distal pulses and normal pulses.  Exam reveals no gallop and no friction rub.   No murmur heard. Pulmonary/Chest: Effort normal and breath sounds normal. Not tachypneic. No respiratory distress. She has no decreased breath sounds. She has no wheezes. She has no rhonchi. She has no rales.  Neurological: She is alert.  Skin: Skin is warm, dry and intact. No rash noted.  Psychiatric: Her speech is normal and behavior is normal. Judgment normal. Her mood appears not anxious. Cognition and memory are normal. She does not exhibit a depressed mood.          Assessment & Plan:

## 2012-04-24 NOTE — Patient Instructions (Addendum)
Mucinex D, start nasal saine 2-3 per day. If not improving fill the antibiotics.

## 2012-04-24 NOTE — Assessment & Plan Note (Signed)
Most likely virla but if not improving with mucinex D and nasal saline.. Fill antibiotics. Given rx given going on vacation.

## 2012-06-05 ENCOUNTER — Ambulatory Visit (HOSPITAL_BASED_OUTPATIENT_CLINIC_OR_DEPARTMENT_OTHER): Payer: Managed Care, Other (non HMO) | Admitting: Oncology

## 2012-06-05 ENCOUNTER — Telehealth: Payer: Self-pay | Admitting: Oncology

## 2012-06-05 VITALS — BP 120/76 | HR 78 | Temp 98.3°F | Resp 18 | Ht 65.0 in | Wt 168.3 lb

## 2012-06-05 DIAGNOSIS — R911 Solitary pulmonary nodule: Secondary | ICD-10-CM

## 2012-06-05 DIAGNOSIS — C801 Malignant (primary) neoplasm, unspecified: Secondary | ICD-10-CM

## 2012-06-05 DIAGNOSIS — D4959 Neoplasm of unspecified behavior of other genitourinary organ: Secondary | ICD-10-CM

## 2012-06-05 DIAGNOSIS — M25519 Pain in unspecified shoulder: Secondary | ICD-10-CM

## 2012-06-05 NOTE — Progress Notes (Signed)
   [facility]    OFFICE PROGRESS NOTE   INTERVAL HISTORY:   She returns as scheduled. She reports a "sore "feeling in the right upper lateral arm. She has occasional tingling and pain at the tip of the right third finger. Pain in the right shoulder improved after an injection by Dr. Mina Marble. She has developed hot flashes for the past several months. These chiefly occur in the evening. The menstrual cycle has been irregular. No change in the flexor tendon nodule at the right hand. She is concerned about the possibility of an ovarian cyst seen on the initial staging PET scan at [facility].  Objective:  Vital signs in last 24 hours:  Blood pressure 120/76, pulse 78, temperature 98.3 F (36.8 C), temperature source Oral, resp. rate 18, height 5\' 5"  (1.651 m), weight 168 lb 4.8 oz (76.34 kg).    HEENT: Neck without mass Lymphatics: No cervical, supraclavicular, or axillary nodes Resp: Lungs clear bilateral Cardio: Regular rate and rhythm GI: No hepatosplenomegaly Vascular: No leg edema. The right arm appears slightly larger than the left side. No hand edema Musculoskeletal: Status post right third finger amputation-no evidence of local tumor recurrence. Nodularity over the flexor tendon at the right palm. Examination of the right upper arm and shoulder is unremarkable     Medications: I have reviewed the patient's current medications.  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 ? 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 History of tiny lung nodules on a CT of the chest in 2009 , stable on a CT 08/12/2011.  4. Right shoulder/upper arm discomfort-likely related to benign musculoskeletal condition or the axillary lymph node dissection-she noted improvement in the right shoulder discomfort after an injection by Dr.  Mina Marble 5. 2.5 cm  low-density lesion in the right adnexa on the PET scan at [facility] 09/14/2011   Disposition:  She remains in clinical remission from the papillary adenocarcinoma. She would like to evaluate the right adnexa lesion further. She will be referred for a pelvic ultrasound. I recommend she followup with her gynecologist to evaluate the hot flashes and irregular menses.  She will return for an office visit and chest x-ray in 4 months.   Thornton Papas, MD  06/05/2012  6:08 PM

## 2012-06-12 ENCOUNTER — Ambulatory Visit (HOSPITAL_COMMUNITY): Payer: Managed Care, Other (non HMO)

## 2012-06-13 ENCOUNTER — Ambulatory Visit (HOSPITAL_COMMUNITY)
Admission: RE | Admit: 2012-06-13 | Discharge: 2012-06-13 | Disposition: A | Discharge status: Home / Self Care | Payer: Managed Care, Other (non HMO) | Source: Ambulatory Visit | Attending: Oncology | Admitting: Oncology

## 2012-06-13 DIAGNOSIS — R1900 Intra-abdominal and pelvic swelling, mass and lump, unspecified site: Secondary | ICD-10-CM | POA: Insufficient documentation

## 2012-06-13 DIAGNOSIS — Z85830 Personal history of malignant neoplasm of bone: Secondary | ICD-10-CM | POA: Insufficient documentation

## 2012-06-13 DIAGNOSIS — C801 Malignant (primary) neoplasm, unspecified: Secondary | ICD-10-CM

## 2012-06-13 DIAGNOSIS — D252 Subserosal leiomyoma of uterus: Secondary | ICD-10-CM | POA: Insufficient documentation

## 2012-06-14 ENCOUNTER — Telehealth: Payer: Self-pay | Admitting: *Deleted

## 2012-06-14 NOTE — Telephone Encounter (Signed)
Message copied by Caleb Popp on Thu Jun 14, 2012 10:56 AM ------      Message from: Thornton Papas B      Created: Wed Jun 13, 2012  7:06 PM       Please call patient, 1 uterine fibroid, ovaries are normal, f/u as scheduled, copy to her gyn ------

## 2012-06-14 NOTE — Telephone Encounter (Signed)
Called pt with [place] results. Ovaries are normal, shows one uterine fibroid. Follow up with Dr. Truett Perna as scheduled. Pt voiced understanding. Requested copy to be sent to her PCP Amy Bedsole. Same done.

## 2012-06-14 NOTE — Telephone Encounter (Signed)
Noted. Results will be reported to pt by ordering physician.

## 2012-07-11 ENCOUNTER — Other Ambulatory Visit: Payer: Self-pay | Admitting: Oncology

## 2012-07-11 DIAGNOSIS — C801 Malignant (primary) neoplasm, unspecified: Secondary | ICD-10-CM

## 2012-10-01 ENCOUNTER — Telehealth: Payer: Self-pay | Admitting: Oncology

## 2012-10-01 ENCOUNTER — Ambulatory Visit (HOSPITAL_COMMUNITY)
Admission: RE | Admit: 2012-10-01 | Discharge: 2012-10-01 | Disposition: A | Discharge status: Home / Self Care | Payer: Managed Care, Other (non HMO) | Source: Ambulatory Visit | Attending: Oncology | Admitting: Oncology

## 2012-10-01 ENCOUNTER — Ambulatory Visit (HOSPITAL_BASED_OUTPATIENT_CLINIC_OR_DEPARTMENT_OTHER): Payer: Managed Care, Other (non HMO) | Admitting: Oncology

## 2012-10-01 ENCOUNTER — Other Ambulatory Visit: Payer: Managed Care, Other (non HMO) | Admitting: Lab

## 2012-10-01 VITALS — BP 122/71 | HR 72 | Temp 98.1°F | Resp 20 | Ht 65.0 in | Wt 169.0 lb

## 2012-10-01 DIAGNOSIS — C801 Malignant (primary) neoplasm, unspecified: Secondary | ICD-10-CM

## 2012-10-01 DIAGNOSIS — C7640 Malignant neoplasm of unspecified upper limb: Secondary | ICD-10-CM | POA: Insufficient documentation

## 2012-10-01 DIAGNOSIS — M25519 Pain in unspecified shoulder: Secondary | ICD-10-CM

## 2012-10-01 DIAGNOSIS — D259 Leiomyoma of uterus, unspecified: Secondary | ICD-10-CM

## 2012-10-01 NOTE — Progress Notes (Signed)
   [facility]    OFFICE PROGRESS NOTE   INTERVAL HISTORY:   She returns as scheduled. She has relocated to [facility]. She feels well. No complaint. No swelling at the right arm. The menstrual cycle is irregular. She has scheduled an appointment with a gynecologist at [facility].  Objective:  Vital signs in last 24 hours:  Blood pressure 122/71, pulse 72, temperature 98.1 F (36.7 C), temperature source Oral, resp. rate 20, height 5\' 5"  (1.651 m), weight 169 lb (76.658 kg), last menstrual period 08/30/2012.    HEENT: Neck without mass Lymphatics: No cervical, supraclavicular, or axillary nodes Resp: Lungs clear bilaterally Cardio: Regular rate and rhythm GI: No hepatomegaly Vascular: No leg edema, no right arm edema  Musculoskeletal: Status post distal right third finger amputation-no evidence of local tumor recurrence. Nodularity over the flexor tendon at the right palm. No evidence of recurrent tumor at the right arm.  X-rays: Chest x-ray 10/01/2012-lungs are clear, no pleural effusion   Medications: I have reviewed the patient's current medications.  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 ? 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 History of tiny lung nodules on a CT of the chest in 2009 , stable on a CT 08/12/2011.  4. Right shoulder/upper arm discomfort-likely related to benign musculoskeletal condition or the axillary lymph node dissection-she noted improvement in the right shoulder discomfort after an injection by Dr. Mina Marble  5. 2.5 cm low-density lesion in the right adnexa on the PET scan at [facility] 09/14/2011 , pelvic ultrasound 07/12/2012 confirmed a uterine fibroid, no ovarian or adnexal mass   Disposition:  She remains in clinical remission from the digital papillary carcinoma. She will  return for an office visit and chest x-ray in 6 months.   Thornton Papas, MD  10/01/2012  9:19 AM

## 2012-10-01 NOTE — Telephone Encounter (Signed)
Pt  changed date of appt to end of february 2015

## 2012-12-27 ENCOUNTER — Other Ambulatory Visit: Payer: Self-pay

## 2013-01-31 ENCOUNTER — Telehealth: Payer: Self-pay | Admitting: Family Medicine

## 2013-01-31 NOTE — Telephone Encounter (Signed)
Pt request call back. Pt wants to discuss a referral for arthritis, pt saw Dr. Patsy Lager in 2011 about hand problems and it has worsen since then and she would like it check out especially since she has had a cancer spot on her finger since then

## 2013-02-01 NOTE — Telephone Encounter (Signed)
Left message for patient that referral for Orthopedic Surgeon has been ordered and Shirlee Limerick who does our referrals will be calling her once she gets this appointment scheduled.

## 2013-02-01 NOTE — Telephone Encounter (Signed)
Referral made to [facility]

## 2013-04-05 ENCOUNTER — Ambulatory Visit: Payer: Managed Care, Other (non HMO) | Admitting: Oncology

## 2013-04-19 ENCOUNTER — Ambulatory Visit (HOSPITAL_BASED_OUTPATIENT_CLINIC_OR_DEPARTMENT_OTHER): Payer: Managed Care, Other (non HMO) | Admitting: Oncology

## 2013-04-19 ENCOUNTER — Ambulatory Visit (HOSPITAL_COMMUNITY)
Admission: RE | Admit: 2013-04-19 | Discharge: 2013-04-19 | Disposition: A | Discharge status: Home / Self Care | Payer: Managed Care, Other (non HMO) | Source: Ambulatory Visit | Attending: Oncology | Admitting: Oncology

## 2013-04-19 VITALS — BP 126/71 | HR 77 | Temp 98.5°F | Resp 20 | Ht 65.0 in | Wt 177.7 lb

## 2013-04-19 DIAGNOSIS — C801 Malignant (primary) neoplasm, unspecified: Secondary | ICD-10-CM

## 2013-04-19 NOTE — Progress Notes (Signed)
   [name]    OFFICE PROGRESS NOTE   INTERVAL HISTORY:   She returns for scheduled followup of the digital papillary carcinoma. No change at the right third finger. She is seeing a rheumatologist for evaluation of joint swelling at the fingers. No swelling or pain at other sites . She reports undergoing a negative cervical biopsy. She reports her dentist felt the left side of the thyroid was enlarged.  Objective:  Vital signs in last 24 hours:  Blood pressure 126/71, pulse 77, temperature 98.5 F (36.9 C), temperature source Oral, resp. rate 20, height 5\' 5"  (1.651 m), weight 177 lb 11.2 oz (80.604 kg), last menstrual period 04/10/2013, SpO2 99.00%.    HEENT: Neck without mass, no asymmetry at the thyroid Lymphatics: No cervical, supraclavicular, axillary, or right epitrochlear nodes Resp: Lungs clear bilaterally Cardio: Regular rate and rhythm GI: No hepatosplenomegaly Vascular: No leg edema Musculoskeletal: Status post distal right third finger amputation. No evidence of local tumor recurrence. Mild edema at the PIP joints of several fingers. Right arm without evidence of recurrent tumor.    X-rays: Chest x-ray 04/19/2013-negative   Medications: I have reviewed the patient's current medications.  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 ? 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 History of tiny lung nodules on a CT of the chest in 2009 , stable on a CT 08/12/2011.  4. Right shoulder/upper arm discomfort-likely related to benign musculoskeletal condition or the axillary lymph node dissection-she noted improvement in the right shoulder discomfort after an injection by Dr. Burney Gauze  5. 2.5 cm low-density lesion in the right adnexa on the PET scan at [facility] 09/14/2011 , pelvic ultrasound 07/12/2012 confirmed a  uterine fibroid, no ovarian or adnexal mass 6. Bilateral hand joint swelling and pain-now followed by a rheumatologist    Disposition:  She remains in clinical remission from the digital carcinoma. She will return for an office visit and chest x-ray in 6 months.   Cassandra Coder, MD  04/19/2013  4:11 PM

## 2013-04-23 ENCOUNTER — Telehealth: Payer: Self-pay | Admitting: Oncology

## 2013-04-23 NOTE — Telephone Encounter (Signed)
s.w. pt and advised on Aug appt....pt ok and aware of appt and to go to radiology b4 appt

## 2013-06-28 ENCOUNTER — Telehealth: Payer: Self-pay | Admitting: Oncology

## 2013-06-28 NOTE — Telephone Encounter (Signed)
Faxed pt medical records to [facility]

## 2013-09-17 IMAGING — US US PELVIS COMPLETE
1 series · 13 of 25 positions shown · non-contrast
Comparison: CT on 02/14/2008

CLINICAL DATA: Pelvic mass seen on recent outside PET scan.
Primary digital papillary adenocarcinoma.   LMP 2 weeks ago.



[Series 1: us pelvis complete · 0.30mm/px · 13 of 49 slices shown]
[im 1/49]
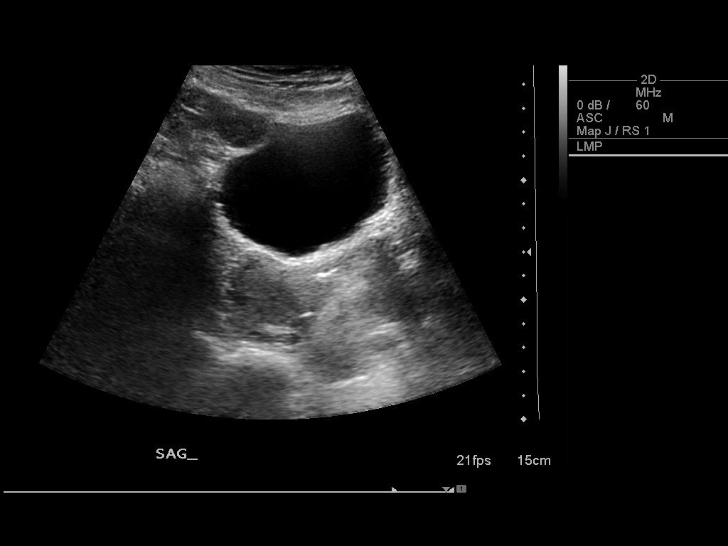
[im 5/49]
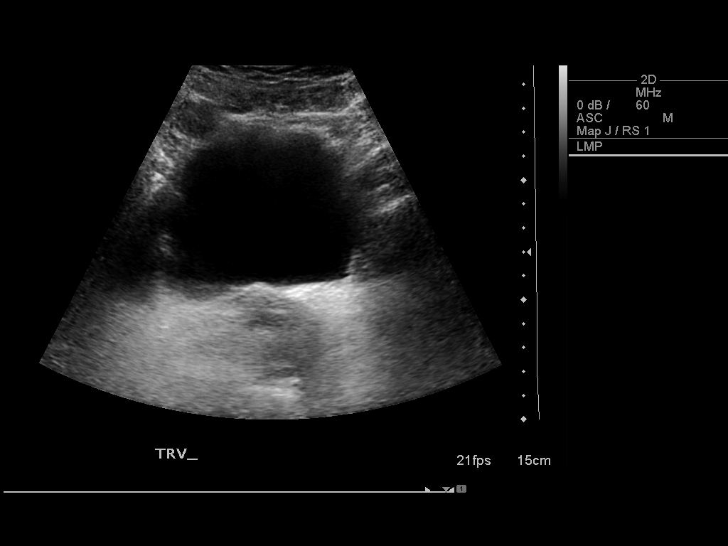
[im 9/49]
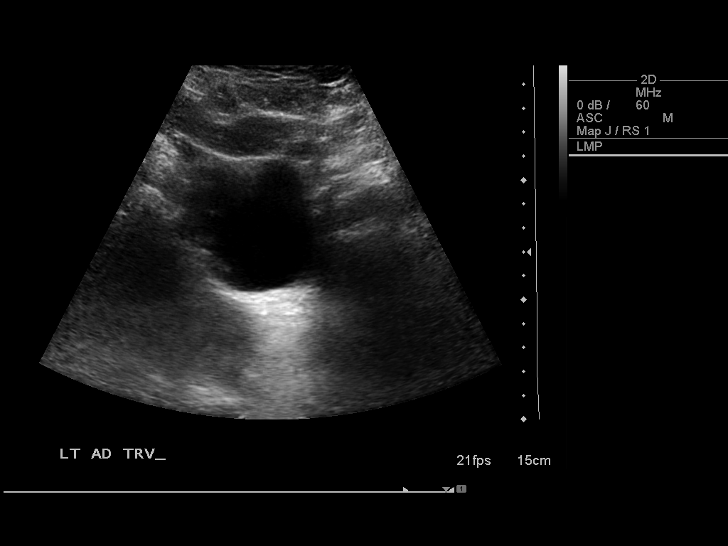
[im 13/49]
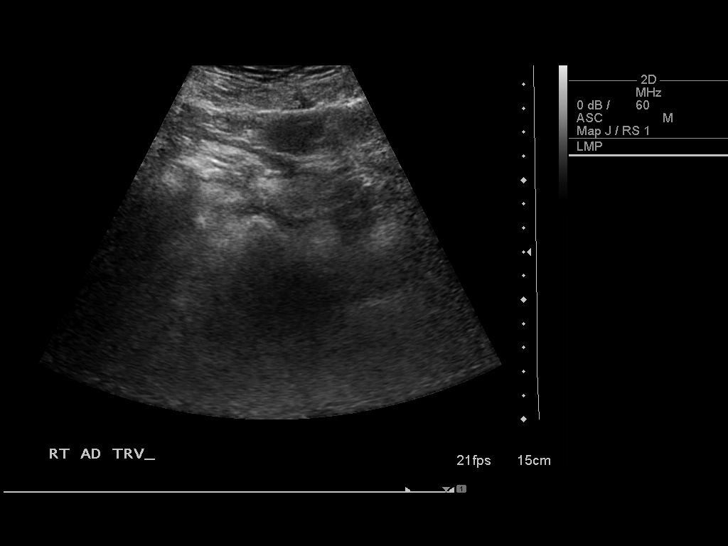
[im 17/49]
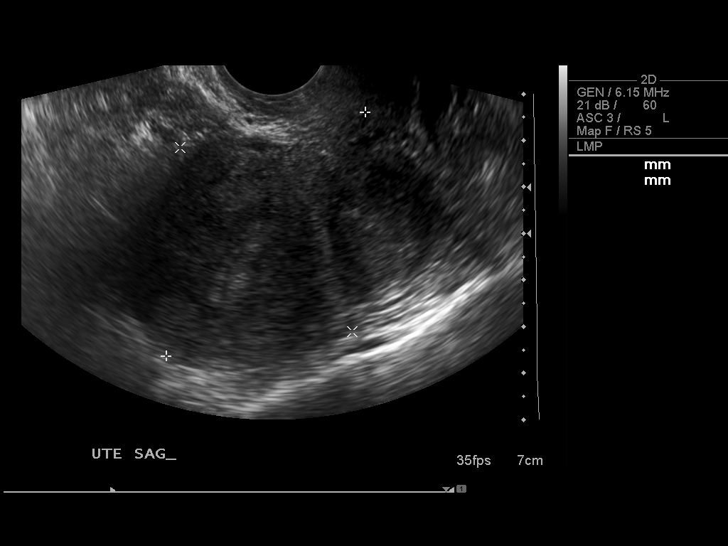
[im 21/49]
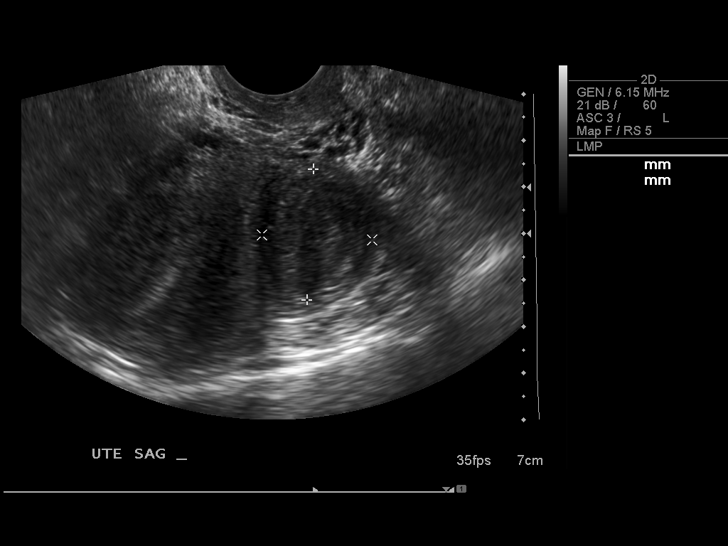
[im 25/49]
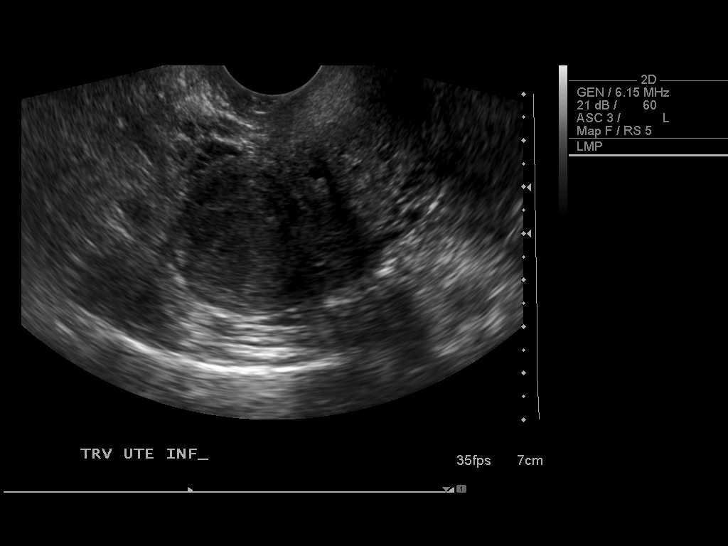
[im 29/49]
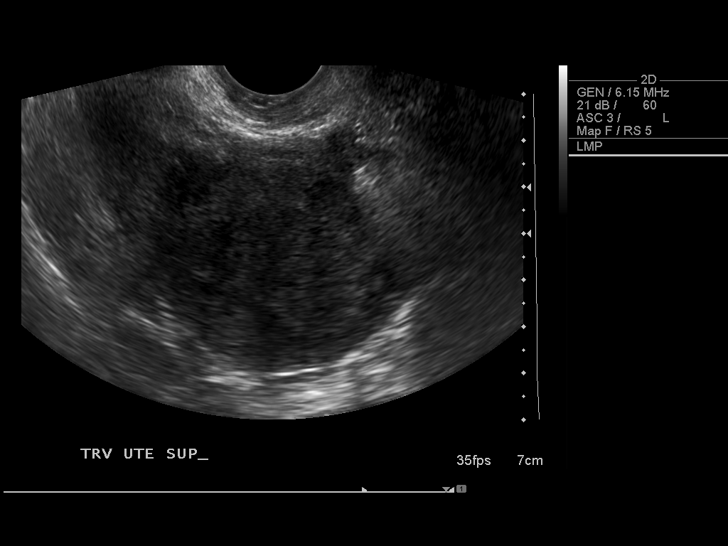
[im 33/49]
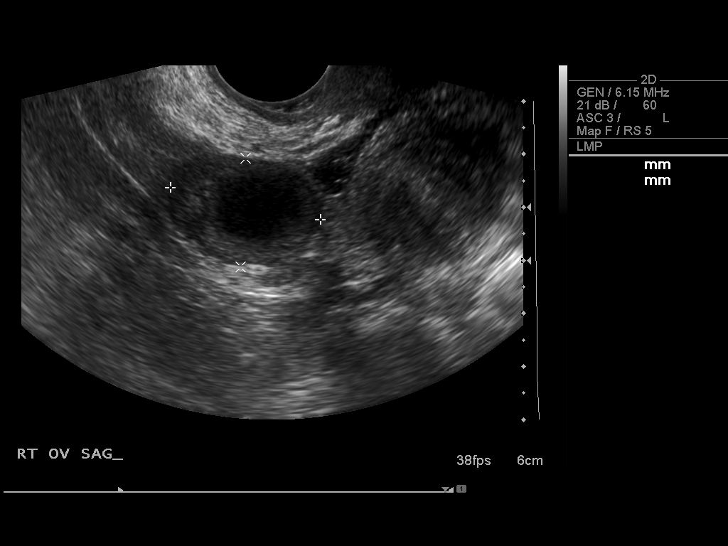
[im 37/49]
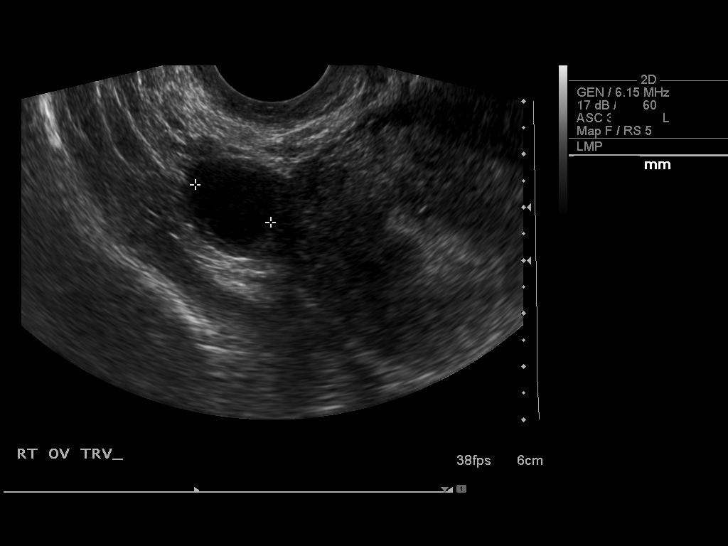
[im 41/49]
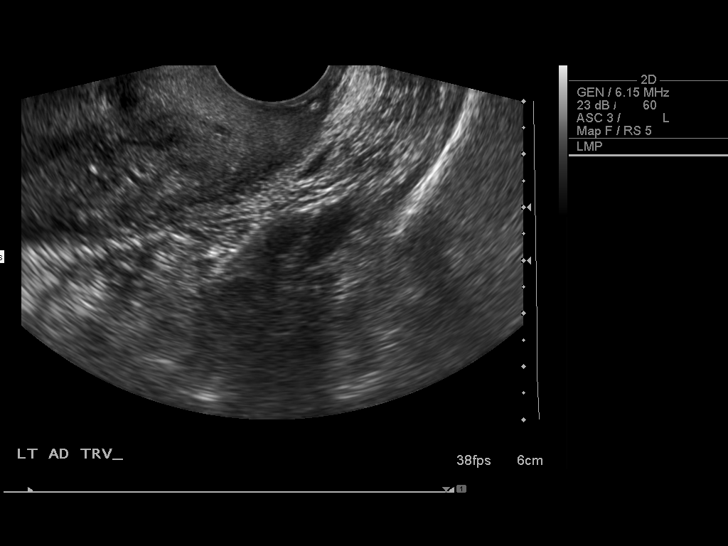
[im 45/49]
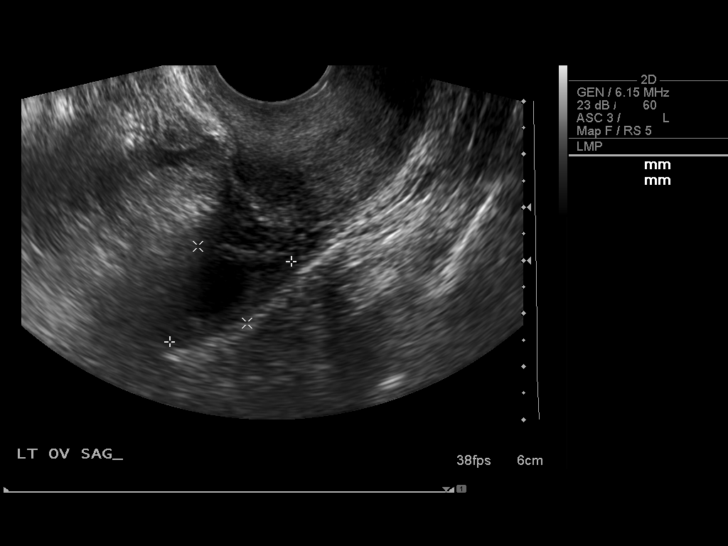
[im 49/49]
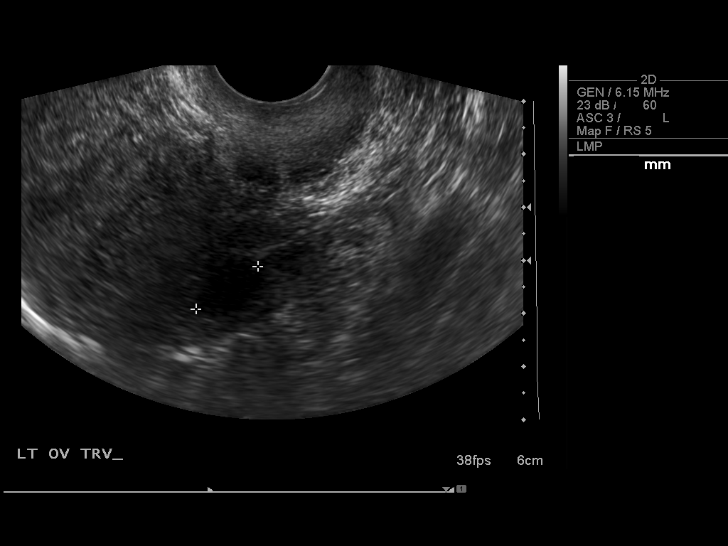

[13 of 25 positions shown; findings below may reference images not displayed]

FINDINGS: Uterus:  6.8 x 5.4 x 5.8 cm.  A single subserosal fibroid is seen
in the posterior lower uterine segment which measures 2.8 x 2.4 x
2.9 cm.  No other fibroids identified.

Endometrium: Double layer thickness measures 7 mm transvaginally.
No focal lesion visualized.

Right ovary: 2.9 x 2.1 x 3.3 cm.  No ovarian or adnexal mass
identified.  A dominant 1.9 cm follicle is noted.

Left ovary: 2.7 x 1.7 x 2.6 cm.  No ovarian or adnexal mass
identified.  A dominant 1.4 cm follicle is noted.

Other Findings:  No free fluid
IMPRESSION: 1.  2.9 cm fibroid in the posterior lower uterine segment.
2.  Normal ovaries.  No ovarian or adnexal mass identified.

## 2013-10-18 ENCOUNTER — Telehealth: Payer: Self-pay | Admitting: Oncology

## 2013-10-18 ENCOUNTER — Ambulatory Visit (HOSPITAL_COMMUNITY)
Admission: RE | Admit: 2013-10-18 | Discharge: 2013-10-18 | Disposition: A | Discharge status: Home / Self Care | Payer: BC Managed Care – PPO | Source: Ambulatory Visit | Attending: Oncology | Admitting: Oncology

## 2013-10-18 ENCOUNTER — Ambulatory Visit (HOSPITAL_BASED_OUTPATIENT_CLINIC_OR_DEPARTMENT_OTHER): Payer: BC Managed Care – PPO | Admitting: Oncology

## 2013-10-18 VITALS — BP 104/83 | HR 72 | Temp 98.0°F | Resp 19 | Ht 65.0 in | Wt 172.0 lb

## 2013-10-18 DIAGNOSIS — Z859 Personal history of malignant neoplasm, unspecified: Secondary | ICD-10-CM | POA: Diagnosis present

## 2013-10-18 DIAGNOSIS — C801 Malignant (primary) neoplasm, unspecified: Secondary | ICD-10-CM

## 2013-10-18 DIAGNOSIS — M25549 Pain in joints of unspecified hand: Secondary | ICD-10-CM

## 2013-10-18 DIAGNOSIS — R05 Cough: Secondary | ICD-10-CM

## 2013-10-18 DIAGNOSIS — R059 Cough, unspecified: Secondary | ICD-10-CM

## 2013-10-18 DIAGNOSIS — M7989 Other specified soft tissue disorders: Secondary | ICD-10-CM

## 2013-10-18 DIAGNOSIS — Z8589 Personal history of malignant neoplasm of other organs and systems: Secondary | ICD-10-CM

## 2013-10-18 NOTE — Telephone Encounter (Signed)
Per 08/28 POF ov mailed sch to pt.Marland Kitchen..KJ

## 2013-10-18 NOTE — Progress Notes (Signed)
  [place] OFFICE PROGRESS NOTE   Diagnosis: Digital carcinoma  INTERVAL HISTORY:   She returns as scheduled. She reports having an upper respiratory infection in the spring of 2015. She developed a recurrent cough that is occasionally productive of sputum in July. She completed another course of antibiotics and the cough improved beginning yesterday. The cough is chiefly in the evening and early mornings. No fever or dyspnea. No hemoptysis. She continues to have arthralgias and is seeing a rheumatologist. Good appetite. She is exercising at a gym.   Objective:  Vital signs in last 24 hours:  Blood pressure 104/83, pulse 72, temperature 98 F (36.7 C), temperature source Oral, resp. rate 19, height 5\' 5"  (1.651 m), weight 172 lb (78.019 kg).    HEENT: Neck without mass Lymphatics: No cervical, supraclavicular, axillary, or epitrochlear nodes Resp: Clear bilaterally in the anterior and posterior lung fields Cardio: Regular rate and rhythm GI: No hepatosplenomegaly Vascular: No leg edema  Musculoskeletal: Status post distal right third finger amputation. No evidence of local tumor recurrence. Right arm without evidence of recurrent tumor. The flexure tendon contracture/cyst at the right hand appears improved   Imaging:  Dg Chest 2 View  10/18/2013   CLINICAL DATA:  History of carcinoma, nonsmoker  EXAM: CHEST  2 VIEW  COMPARISON:  04/19/2013  FINDINGS: The heart size and mediastinal contours are within normal limits. Both lungs are clear. The visualized skeletal structures are unremarkable.  IMPRESSION: No active cardiopulmonary disease.   Electronically Signed   By: Skipper Cliche M.D.   On: 10/18/2013 10:09    Medications: I have reviewed the patient's current medications.  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 ? 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 History of tiny lung nodules on a CT of the chest in 2009 , stable on a CT 08/12/2011.  4. Right shoulder/upper arm discomfort-likely related to benign musculoskeletal condition or the axillary lymph node dissection-she noted improvement in the right shoulder discomfort after an injection by Dr. Burney Gauze  5. 2.5 cm low-density lesion in the right adnexa on the PET scan at [facility] 09/14/2011 , pelvic ultrasound 07/12/2012 confirmed a uterine fibroid, no ovarian or adnexal mass  6. Bilateral hand joint swelling and pain-now followed by a rheumatologist  7. cough-potentially related to a viral URI or allergies. She will continue followup with her medical physician locally.   Disposition:  She remains in clinical remission from the digital carcinoma. I have a low suspicion for progressive tumor causing the cough. She will seek medical attention if the cough persists. She may need a pulmonary evaluation.  Ms. Glidden will return for an office visit and chest x-ray in 9 months. She will contact us in the interim as needed  Betsy Coder, MD  10/18/2013  11:20 AM

## 2013-10-21 ENCOUNTER — Telehealth: Payer: Self-pay | Admitting: Oncology

## 2013-10-21 NOTE — Telephone Encounter (Signed)
pt called today to r/s 07/22/14 appt to 07/10/14 due to commute from [facility] and pt having other buisness in  around the 07/10/14 date. pt has new d/t.

## 2014-04-03 ENCOUNTER — Telehealth: Payer: Self-pay | Admitting: Oncology

## 2014-04-03 NOTE — Telephone Encounter (Signed)
S/w pt confirming updated MD visit per provider schedule, pt needed Friday pm or Monday am, pt is faxing over her pathology report for MD to see before her visit.... KJ

## 2014-05-08 ENCOUNTER — Telehealth: Payer: Self-pay

## 2014-05-08 NOTE — Telephone Encounter (Signed)
Patient called requesting information to be transferred to [facility] in [place] [phone]), because of her commute to [facility] for appointments.  States that she will call and cancel appt scheduled for 07/28/14 if she can get in with her new oncologist sooner.

## 2014-05-12 ENCOUNTER — Telehealth: Payer: Self-pay | Admitting: Oncology

## 2014-05-12 NOTE — Telephone Encounter (Signed)
05/12/14 medical records are being mailed to [facility]  [place], [place]

## 2014-07-10 ENCOUNTER — Ambulatory Visit: Payer: BC Managed Care – PPO | Admitting: Oncology

## 2014-07-22 ENCOUNTER — Ambulatory Visit: Payer: BC Managed Care – PPO | Admitting: Oncology

## 2014-07-22 ENCOUNTER — Encounter: Payer: Self-pay | Admitting: Oncology

## 2014-07-28 ENCOUNTER — Ambulatory Visit: Payer: Self-pay | Admitting: Oncology

## 2020-11-30 ENCOUNTER — Encounter: Payer: Self-pay | Admitting: Gastroenterology
